# Patient Record
Sex: Male | Born: 1980 | Race: White | Hispanic: No | Marital: Married | Smoking: Never smoker
Health system: Southern US, Community
[De-identification: ages and names within clinical notes are randomized; demographics above are authoritative.]

## PROBLEM LIST (undated history)

## (undated) DIAGNOSIS — B360 Pityriasis versicolor: Secondary | ICD-10-CM

## (undated) DIAGNOSIS — N200 Calculus of kidney: Secondary | ICD-10-CM

## (undated) DIAGNOSIS — M722 Plantar fascial fibromatosis: Secondary | ICD-10-CM

## (undated) DIAGNOSIS — E785 Hyperlipidemia, unspecified: Secondary | ICD-10-CM

## (undated) HISTORY — DX: Calculus of kidney: N20.0

## (undated) HISTORY — DX: Plantar fascial fibromatosis: M72.2

## (undated) HISTORY — DX: Pityriasis versicolor: B36.0

## (undated) HISTORY — DX: Hyperlipidemia, unspecified: E78.5

---

## 2007-10-29 ENCOUNTER — Emergency Department: Payer: Self-pay | Admitting: Internal Medicine

## 2007-12-11 ENCOUNTER — Emergency Department: Payer: Self-pay | Admitting: Emergency Medicine

## 2012-07-26 ENCOUNTER — Ambulatory Visit: Payer: Self-pay | Admitting: General Practice

## 2013-01-12 ENCOUNTER — Ambulatory Visit: Payer: Self-pay | Admitting: General Practice

## 2013-01-12 ENCOUNTER — Inpatient Hospital Stay: Payer: Self-pay | Admitting: General Surgery

## 2013-01-12 DIAGNOSIS — K3580 Unspecified acute appendicitis: Secondary | ICD-10-CM

## 2013-01-12 HISTORY — PX: APPENDECTOMY: SHX54

## 2013-01-12 LAB — CBC WITH DIFFERENTIAL/PLATELET
Basophil #: 0 10*3/uL (ref 0.0–0.1)
Eosinophil #: 0 10*3/uL (ref 0.0–0.7)
HCT: 45.3 % (ref 40.0–52.0)
Lymphocyte #: 0.9 10*3/uL — ABNORMAL LOW (ref 1.0–3.6)
Lymphocyte %: 6.5 %
MCH: 29.3 pg (ref 26.0–34.0)
MCHC: 35.3 g/dL (ref 32.0–36.0)
Monocyte #: 0.7 x10 3/mm (ref 0.2–1.0)
Neutrophil %: 87.5 %
Platelet: 162 10*3/uL (ref 150–440)
RBC: 5.47 10*6/uL (ref 4.40–5.90)
WBC: 13.5 10*3/uL — ABNORMAL HIGH (ref 3.8–10.6)

## 2013-01-12 LAB — COMPREHENSIVE METABOLIC PANEL
Anion Gap: 4 — ABNORMAL LOW (ref 7–16)
Chloride: 103 mmol/L (ref 98–107)
Co2: 28 mmol/L (ref 21–32)
Creatinine: 0.87 mg/dL (ref 0.60–1.30)
EGFR (African American): >60
Glucose: 93 mg/dL (ref 65–99)
Osmolality: 270 (ref 275–301)
SGOT(AST): 20 U/L (ref 15–37)

## 2013-01-13 LAB — BASIC METABOLIC PANEL
Calcium, Total: 8.5 mg/dL (ref 8.5–10.1)
EGFR (Non-African Amer.): >60
Glucose: 119 mg/dL — ABNORMAL HIGH (ref 65–99)

## 2013-01-13 LAB — CBC WITH DIFFERENTIAL/PLATELET
Basophil #: 0 10*3/uL (ref 0.0–0.1)
Basophil %: 0.1 %
Eosinophil %: 0 %
HCT: 41.9 % (ref 40.0–52.0)
HGB: 14.8 g/dL (ref 13.0–18.0)
Lymphocyte #: 0.6 10*3/uL — ABNORMAL LOW (ref 1.0–3.6)
MCH: 29.4 pg (ref 26.0–34.0)
MCHC: 35.3 g/dL (ref 32.0–36.0)
Monocyte %: 6.7 %
Platelet: 152 10*3/uL (ref 150–440)
RBC: 5.03 10*6/uL (ref 4.40–5.90)
RDW: 12.8 % (ref 11.5–14.5)
WBC: 11.8 10*3/uL — ABNORMAL HIGH (ref 3.8–10.6)

## 2013-01-14 LAB — CBC WITH DIFFERENTIAL/PLATELET
Basophil #: 0 10*3/uL (ref 0.0–0.1)
Basophil %: 0.2 %
Eosinophil %: 0.2 %
HGB: 14.1 g/dL (ref 13.0–18.0)
Lymphocyte #: 0.7 10*3/uL — ABNORMAL LOW (ref 1.0–3.6)
Lymphocyte %: 6 %
MCV: 83 fL (ref 80–100)
Monocyte #: 0.7 x10 3/mm (ref 0.2–1.0)
Monocyte %: 6.1 %
Neutrophil #: 9.7 10*3/uL — ABNORMAL HIGH (ref 1.4–6.5)
Neutrophil %: 87.5 %
Platelet: 153 10*3/uL (ref 150–440)
RBC: 4.82 10*6/uL (ref 4.40–5.90)

## 2013-01-15 LAB — URINALYSIS, COMPLETE
Bacteria: NONE SEEN
Bilirubin,UR: NEGATIVE
Blood: NEGATIVE
Ketone: NEGATIVE
Ph: 6 (ref 4.5–8.0)
Squamous Epithelial: <1
WBC UR: 1 /HPF (ref 0–5)

## 2013-01-17 ENCOUNTER — Encounter: Payer: Self-pay | Admitting: *Deleted

## 2013-01-17 LAB — CBC WITH DIFFERENTIAL/PLATELET
HCT: 35.9 % — ABNORMAL LOW (ref 40.0–52.0)
HGB: 12.7 g/dL — ABNORMAL LOW (ref 13.0–18.0)
Lymphocyte %: 13.9 %
MCH: 29.5 pg (ref 26.0–34.0)
MCHC: 35.4 g/dL (ref 32.0–36.0)
MCV: 83 fL (ref 80–100)
Monocyte #: 0.6 x10 3/mm (ref 0.2–1.0)
Monocyte %: 8.4 %
Platelet: 187 10*3/uL (ref 150–440)
RBC: 4.31 10*6/uL — ABNORMAL LOW (ref 4.40–5.90)

## 2013-01-19 ENCOUNTER — Encounter: Payer: Self-pay | Admitting: General Surgery

## 2013-01-20 ENCOUNTER — Encounter: Payer: Self-pay | Admitting: General Surgery

## 2013-01-26 ENCOUNTER — Ambulatory Visit (INDEPENDENT_AMBULATORY_CARE_PROVIDER_SITE_OTHER): Payer: BC Managed Care – PPO | Admitting: General Surgery

## 2013-01-26 ENCOUNTER — Encounter: Payer: Self-pay | Admitting: General Surgery

## 2013-01-26 VITALS — BP 118/78 | HR 80 | Resp 12 | Ht 69.0 in | Wt 210.0 lb

## 2013-01-26 DIAGNOSIS — K3580 Unspecified acute appendicitis: Secondary | ICD-10-CM | POA: Insufficient documentation

## 2013-01-26 NOTE — Progress Notes (Signed)
Patient here today for postop visit.  Laparoscopic appendectomy done 01-12-13.  States he is doing well and improving.  He had a very friable appendix with early phlegmonous changes. Accordingly he was treated with 5 day course of Cipro and Flagyl on discharge.  Overall doing better. Had some night sweats which seems to be resolving. Has completed a course of Cipro and Flagyl.   Sclera no icterus.  Lungs clear Abdomen soft non tender. Port sites healing well.   Impression: Doing well post lap appendectomy.

## 2013-01-26 NOTE — Patient Instructions (Addendum)
Patient may return to work on Monday 01/30/13. Call for any problems. Follow up as needed.

## 2013-02-02 ENCOUNTER — Ambulatory Visit: Payer: Self-pay

## 2013-02-02 ENCOUNTER — Telehealth: Payer: Self-pay | Admitting: *Deleted

## 2013-02-02 ENCOUNTER — Emergency Department: Payer: Self-pay | Admitting: Emergency Medicine

## 2013-02-02 LAB — COMPREHENSIVE METABOLIC PANEL
Alkaline Phosphatase: 130 U/L (ref 50–136)
Calcium, Total: 9.1 mg/dL (ref 8.5–10.1)
Chloride: 101 mmol/L (ref 98–107)
Creatinine: 0.93 mg/dL (ref 0.60–1.30)
EGFR (African American): >60
Potassium: 3.5 mmol/L (ref 3.5–5.1)
SGPT (ALT): 41 U/L (ref 12–78)
Total Protein: 7.6 g/dL (ref 6.4–8.2)

## 2013-02-02 LAB — URINALYSIS, COMPLETE
Blood: NEGATIVE
Leukocyte Esterase: NEGATIVE
Nitrite: NEGATIVE
Ph: 5 (ref 4.5–8.0)
RBC,UR: NONE SEEN /HPF (ref 0–5)
Specific Gravity: >=1.03 (ref 1.003–1.030)
Squamous Epithelial: NONE SEEN

## 2013-02-02 LAB — CBC WITH DIFFERENTIAL/PLATELET
Basophil %: 0.5 %
Eosinophil #: 0.1 10*3/uL (ref 0.0–0.7)
Lymphocyte #: 1.2 10*3/uL (ref 1.0–3.6)
MCHC: 34.1 g/dL (ref 32.0–36.0)
MCV: 83 fL (ref 80–100)
Monocyte #: 0.7 x10 3/mm (ref 0.2–1.0)
Monocyte %: 7.5 %
Neutrophil %: 78.1 %
RDW: 12.3 % (ref 11.5–14.5)

## 2013-02-02 LAB — AMYLASE: Amylase: 46 U/L (ref 25–115)

## 2013-02-02 NOTE — Telephone Encounter (Signed)
Phone call from Brett Frank states he has noticed some lower abdominal pain since Sunday, Monday loose/diarrhea stool/Tuesday temp 101/Wednesday chills.  No vomiting.  He is post appendectomy done 01-12-13. He was able to eat a butter biscuit this morning but his appetite is "just not there".  Advised to treat fever with tylenol/advil.  No one else in the family seems to be "sick". Monitor stools and pain and to call back Friday or even Monday with a status update that this maybe something separate and not surgery related. I told him I would let Dr Evette Cristal know and that the Brett Frank would be calling back. He is aware to call back sooner if new symptoms develop or he gets worse.

## 2013-02-03 ENCOUNTER — Telehealth: Payer: Self-pay

## 2013-02-03 NOTE — Telephone Encounter (Signed)
Patient called to let us know that he was seen at the [facility] urgent care in [facility] yesterday, and was then sent to the ER last night at [facility]. He had an abdominal scan done while there. He states that there was no definite diagnoses and that the doctor there placed him on antibiotics and told him he should be seen here to reassess his abdomen. He is scheduled to see Dr Evette Cristal on 02/06/13 at 9:45 am. I advised him to call us back if he has any significant changes to his condition as we have a doctor on-call 24 hours. Patient expressed understanding and will return here to be seen on 02/06/13.

## 2013-02-06 ENCOUNTER — Encounter: Payer: Self-pay | Admitting: General Surgery

## 2013-02-06 ENCOUNTER — Ambulatory Visit (INDEPENDENT_AMBULATORY_CARE_PROVIDER_SITE_OTHER): Payer: BC Managed Care – PPO | Admitting: General Surgery

## 2013-02-06 VITALS — BP 130/70 | HR 68 | Temp 97.5°F | Resp 12 | Ht 69.0 in | Wt 209.0 lb

## 2013-02-06 DIAGNOSIS — K3580 Unspecified acute appendicitis: Secondary | ICD-10-CM

## 2013-02-06 DIAGNOSIS — R509 Fever, unspecified: Secondary | ICD-10-CM | POA: Insufficient documentation

## 2013-02-06 MED ORDER — ONDANSETRON HCL 4 MG PO TABS: 4.0000 mg | ORAL_TABLET | Freq: Three times a day (TID) | ORAL | Status: DC | PRN

## 2013-02-06 NOTE — Patient Instructions (Addendum)
Patient to return in 1 week to follow up. Call if he has no improvement in next couple of days.

## 2013-02-06 NOTE — Progress Notes (Signed)
Patient ID: Brett Frank, male   DOB: [birth year], 32 y.o.   MRN: [id number]  Chief Complaint  Patient presents with  . Abdominal Pain  . Fever    HPI Brett Frank is a 32 y.o. male who presents for abdominal pain and fever. The patient just had his appendix removed on 01/12/13. The patient complains of a fever on going since Tuesday(6days ago). He went to [facility] which sent him to the ER of a CT scan. He has had two episodes of vomiting once on Friday and one Yesterday. He states he feels like anything he eats or drinks sits on his stomach making him feel nausea.   HPI  History reviewed. No pertinent past medical history.  Past Surgical History  Procedure Laterality Date  . Appendectomy  01-12-13    History reviewed. No pertinent family history.  Social History History  Substance Use Topics  . Smoking status: Never Smoker   . Smokeless tobacco: Not on file  . Alcohol Use: Yes    Allergies  Allergen Reactions  . No Known Allergies     Current Outpatient Prescriptions  Medication Sig Dispense Refill  . ciprofloxacin (CIPRO) 500 MG tablet Take 1 tablet by mouth 2 (two) times daily.      . metroNIDAZOLE (FLAGYL) 500 MG tablet Take 1 tablet by mouth 2 (two) times daily.      . ondansetron (ZOFRAN) 4 MG tablet Take 1 tablet (4 mg total) by mouth every 8 (eight) hours as needed for nausea.  20 tablet  0   No current facility-administered medications for this visit.    Review of Systems Review of Systems  Constitutional: Positive for fever.  Respiratory: Negative.   Cardiovascular: Negative.   Gastrointestinal: Positive for nausea and vomiting.    Blood pressure 130/70, pulse 68, temperature 97.5 F (36.4 C), temperature source Oral, resp. rate 12, height 5\' 9"  (1.753 m), weight 209 lb (94.802 kg).  Physical Exam Physical Exam  Constitutional: He appears well-developed and well-nourished.  Eyes: Conjunctivae are normal. No scleral icterus.  Neck: No  thyromegaly present.  Cardiovascular: Normal rate, regular rhythm and normal heart sounds.   No murmur heard. Pulmonary/Chest: Effort normal and breath sounds normal.  Abdominal: Soft. Bowel sounds are normal. There is no tenderness.  Lymphadenopathy:    He has no cervical adenopathy.  Neurological: He is alert.  Skin: Skin is warm and dry.    Data Reviewed CT scan from last weekend- small fluid collection in rt lower abd/pelvis  Assessment    Likely residual infection lower abdomen.      Plan    Pt started on Cipro/Flagyl 2 days ago. Rx sent for Zofran also. Pt to complete his antibiotics and f/u in 1 week.        Makyia Erxleben G 02/06/2013, 12:24 PM

## 2013-02-08 ENCOUNTER — Telehealth: Payer: Self-pay | Admitting: *Deleted

## 2013-02-08 NOTE — Telephone Encounter (Signed)
Patient states he is feeling better today.  No nausea last night or today.  No fever or chills.Tolerating antibiotic.  He will call if his status changes.

## 2013-02-14 ENCOUNTER — Encounter: Payer: Self-pay | Admitting: General Surgery

## 2013-02-14 ENCOUNTER — Ambulatory Visit (INDEPENDENT_AMBULATORY_CARE_PROVIDER_SITE_OTHER): Payer: BC Managed Care – PPO | Admitting: General Surgery

## 2013-02-14 VITALS — BP 120/80 | HR 80 | Temp 96.4°F | Resp 16 | Ht 69.0 in | Wt 206.0 lb

## 2013-02-14 DIAGNOSIS — R509 Fever, unspecified: Secondary | ICD-10-CM

## 2013-02-14 NOTE — Progress Notes (Addendum)
Patient here today for follow up postoperative fever post laparoscopy appendectomy done 01-12-13. No new complaints and has completed antibiotic therapy.  Last 2 days he has felt a lot better.  Abdomen soft and non tender with normal bowel sounds.

## 2013-02-14 NOTE — Patient Instructions (Addendum)
The patient is aware to call back for any questions or concerns.  

## 2014-03-24 ENCOUNTER — Emergency Department: Payer: Self-pay | Admitting: Emergency Medicine

## 2014-06-24 ENCOUNTER — Ambulatory Visit: Payer: Self-pay | Admitting: Family Medicine

## 2014-09-14 NOTE — Op Note (Signed)
PATIENT NAME:  Brett Frank, Brett Frank MR#:  [id number] DATE OF BIRTH:  1980/07/16  DATE OF PROCEDURE:  01/12/2013  PREOPERATIVE DIAGNOSIS: Acute appendicitis.   POSTOPERATIVE DIAGNOSIS: Acute appendicitis.   OPERATION: Laparoscopy and appendectomy.   SURGEON:  Kathreen CosierS. G. Sankar, M.D.   ANESTHESIA: General.   COMPLICATIONS: None.   ESTIMATED BLOOD LOSS: 50 mL.   DRAINS: None.   DESCRIPTION OF PROCEDURE: The patient was put to sleep in the supine position on the operating table. Foley catheter was inserted which was removed at the end of the procedure. After a timeout procedure was performed, the abdomen was prepped and draped out as a sterile field. The initial entry was made of the umbilicus with a small incision. A Veress needle with the InnerDyne sleeve was positioned in the peritoneal cavity and verified with the hanging drop method.  Pneumoperitoneum was obtained and a 10 mm port was placed. The camera was placed and under direct vision, a suprapubic 5 mm port of the left lower quadrant and 12 mm ports were placed. The cecum was a high-lying position, more or less in the mid abdomen on the right, but the appendix was not easily visualized at this time. The omentum was not involved in this area. There was some mild edema of the mesentery of the terminal ileum. With careful exposure and retraction of the cecum in the ileocecal junction towards the medial aspect, an acutely inflamed appendix adherent to the surface of the mesentery of the terminal ileum was identified and this was carefully freed until it could be lifted up for more adequate evaluation. There was a moderate amount of edema in this region along with what appeared to be a little bit of murky fluid in the course of dissection. No frank rupture was noted at this time. The tip of the appendix was small but the midportion was markedly dilated and friable with the adjoining mesoappendix being also markedly thickened. With careful dissection the  mesoappendix was freed and taken down with a 3 applications of the white load of the GIA. The base of the appendix was then carefully delineated and it was noted there was a small open area in the middle of the inflamed appendix during dissection. The friable portion from the mid area of the appendix opened and a fecalith was noted. This was separately removed and the base of the appendix was then stapled across with the blue load of the GIA. The appendix was placed in a retrieval bag and brought out through the left lower quadrant port site. The area was irrigated with some saline and suctioned out. Careful inspection of the staple line showed it to be intact and after ensuring hemostasis and aspiration of all fluid from this area, the ports were removed and pneumoperitoneum was released. The fascial opening at the left lower quadrant and umbilical sites were closed with 0 Vicryl and the skin closed with subcuticular 4-0 Vicryl reinforced with Steri-Strips and a dry sterile dressing was placed. The patient tolerated the procedure well. There were no immediate problems encountered. He was extubated and returned to the recovery room in stable condition    ____________________________ S.Wynona LunaG. Sankar, MD sgs:dp D: 01/13/2013 08:18:00 ET T: 01/13/2013 08:51:58 ET JOB#: [id number]  cc: S.G. Evette CristalSankar, MD, <Dictator> [facility] Wynona LunaG SANKAR MD ELECTRONICALLY SIGNED 01/16/2013 12:15

## 2014-09-14 NOTE — Discharge Summary (Signed)
PATIENT NAME:  Brett Frank, Brett Frank MR#:  [id number] DATE OF BIRTH:  1980/10/31  DATE OF ADMISSION:  01/12/2013 DATE OF DISCHARGE:  01/17/2013  HISTORY: This is a healthy 34 year old male who presented with complaint of abdominal pain of about a one day duration. The pain persisted and was worse on the day of admission. It was located in the lower abdomen more so on the right than the left. His appetite was markedly decreased. He had a low-grade fever the night before, but no nausea or vomiting. He had no history of any prior abdominal problems.   PHYSICAL EXAMINATION: Remarkable for tenderness in the right lower quadrant, which is fairly marked with some guarding and rebound. CT scan confirmed the presence of a markedly enlarged appendix with some evidence of stranding surrounding and appendicoliths that were noted. Findings were consistent with an acute appendicitis. There was no evidence of rupture or abscess formation.   COURSE IN HOSPITAL: With a diagnosis of acute appendicitis, surgical removal was discussed with the patient.  He was taken to the operating room on 08/21 and underwent laparoscopy and appendectomy. It was noted there was a little bit of phlegmonous changes that were in the right lower quadrant above the   the appendix that was lying well behind the terminal ileum and cecum and required a small amount of dissection to free this. It was also noted to be very friable in the midportion. Appendectomy was completed and because of the changes noted at surgery the patient was initially started with Invanz preoperatively and this was continued. Over the next 2 to 3 days the patient had a very gradual improvement in his condition. He continued to have a little low-grade fever off and on which subsequently resolved. It also happened that the patient was hypoxic and was noted to have some atelectasis involving the left base and with encouragement with breathing, incentive spirometry and ambulation, this  resolved prior to time of discharge. His white count was 13,000 at admission and was down to 7000 prior to discharge. His oxygen saturations were normal 24 hours prior to discharge, but clinically much improved breath sounds on the left base. He was discharged in stable condition on 08/26 to be followed as an outpatient.    FINAL DIAGNOSES: 1. Acute appendicitis.  2. Atelectasis left lung base with mild hypoxia.   OPERATION PERFORMED: Laparoscopy and appendectomy   ____________________________ S.Wynona LunaG. Sankar, MD sgs:sg D: 01/30/2013 11:42:10 ET T: 01/30/2013 11:54:36 ET JOB#: [id number]  cc: S.G. Evette CristalSankar, MD, <Dictator> [facility] Wynona LunaG SANKAR MD ELECTRONICALLY SIGNED 01/30/2013 17:00

## 2014-09-14 NOTE — H&P (Signed)
Subjective/Chief Complaint abd pain   History of Present Illness Pt started having crampy abd pain about 1 pm yesterday. Pain has peristed and worse this am, located lower abd. No appetite. low grade fever last night.No n/v. No prior abd problems.   Past History kidney stones.   Past Medical Health None   Past Med/Surgical Hx:  Negative, patient denies medical history.:   None, patient reports no surgical history.:   ALLERGIES:  No Known Allergies:   Family and Social History:  Family History Non-Contributory   Review of Systems:  Fever/Chills Yes   Cough No   Abdominal Pain Yes   Diarrhea No   Constipation No   Nausea/Vomiting No   SOB/DOE No   Chest Pain No   Tolerating Diet No  no appetite   Physical Exam:  GEN well developed, well nourished   HEENT pink conjunctivae, dry oral mucosa   NECK supple  No masses   RESP normal resp effort  clear BS   CARD regular rate  no murmur   ABD positive tenderness  no liver/spleen enlargement  no hernia  normal BS  marked rlq tenderness   LYMPH negative neck   SKIN No rashes, skin turgor good   PSYCH A+O to time, place, person   Radiology Results: CT:    21-Aug-14 14:18, CT Abdomen and Pelvis With Contrast  CT Abdomen and Pelvis With Contrast  REASON FOR EXAM:    ADD ON  CALL REPORT  [phone]  RLQ pain nausea   decreased appetite eval appen...  COMMENTS:       PROCEDURE: CT  - CT ABDOMEN / PELVIS  W  - Jan 12 2013  2:18PM     RESULT: CT abdomen pelvis dated 01/12/2013    Technique: Helical 3 mm sections were obtained from the lung bases   through the pubic symphysis status post intravenous administration of 100   mL of Isovue-370 and oral contrast.    Findings: Hypoventilation is identified within the lung bases.    Low attenuating areaprojects just anterior to the falciform ligament     this likely represents an area of focal fatty infiltration. There is no   evidence of abdominal or pelvic free  fluid, loculated fluid collections   Liver is otherwise unremarkable. The spleen, adrenals, pancreas, kidneys   are unremarkable.    The appendix projects in the medial pericecal location mid portions   resting along the silhouettes musculature best appreciated on images 97   through 117. The appendix is fluid-filled and dilated with amaximal   diameter of  2.4 cm. There is appendiceal wall thickening an areas   consistent with appendicoliths. There is a small amount of   periappendiceal fluid.. No drainable loculated fluid collection   identified nor free air.     There is no evidence of bowel obstruction, enteritis, colitis no   diverticulitis. No evidence of abdominal aortic aneurysm. The celiac,     SMA, IMA, portal vein are opacified.    IMPRESSION:  Findings consistent with appendicitis.   2. Appendicoliths identified.   3. Dr. Dorothey BasemanStrickland the patient's attending physician was informed of these   findings the time of initial interpretation.   4. There is no evidence of drainable associated drainable loculated fluid   collections. Free fluid is identified within the pelvis.        Verified By: Jani FilesHECTOR W. COOPER, M.D., MD    Assessment/Admission Diagnosis Ct , clinical exam consistent with acute appendicitis.  Plan Laparoscopy,appendectomy. Discussed fully with patient and he is agreeable.   Electronic Signatures: Kieth Brightly (MD)  (Signed 21-Aug-14 16:11)  Authored: CHIEF COMPLAINT and HISTORY, PAST MEDICAL/SURGIAL HISTORY, ALLERGIES, FAMILY AND SOCIAL HISTORY, REVIEW OF SYSTEMS, PHYSICAL EXAM, Radiology, ASSESSMENT AND PLAN   Last Updated: 21-Aug-14 16:11 by Kieth Brightly (MD)

## 2015-01-03 IMAGING — CT CT ABD-PELV W/ CM
1 of 2 series · 15 of 32 positions shown, 19 images · non-contrast
Comparison: none

REASON FOR EXAM: [id number] abdomen pain appendectomy 3 weeks ago
COMMENTS:

PROCEDURE:     CT  - CT ABDOMEN / PELVIS  W  - February 02, 2013  [DATE]
RESULT:     History: Pain. Prior appendectomy.
Comparison Study: CT 01/12/2013.

[Series 2: 3mm soft tissue · axial · 0.76mm/px · z∈[-507,-33]mm · 15 of 174 slices shown, 19 images]
[im 8/174  soft-tissue]
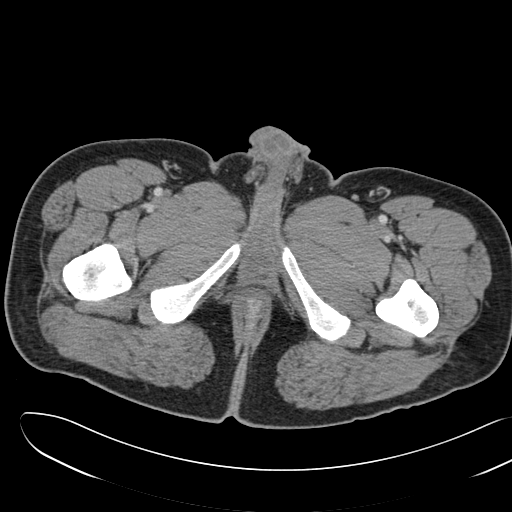
[im 8/174  bone]
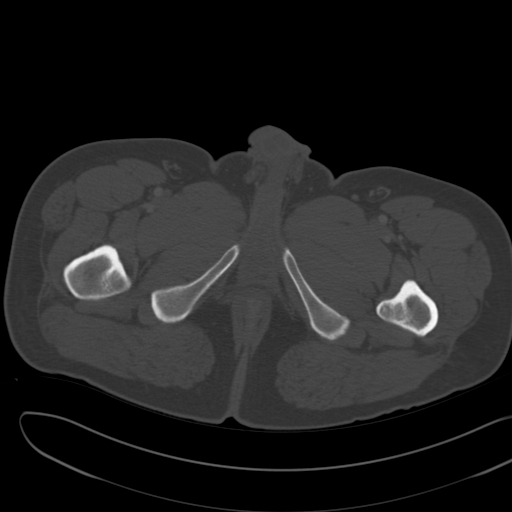
[im 22/174  soft-tissue]
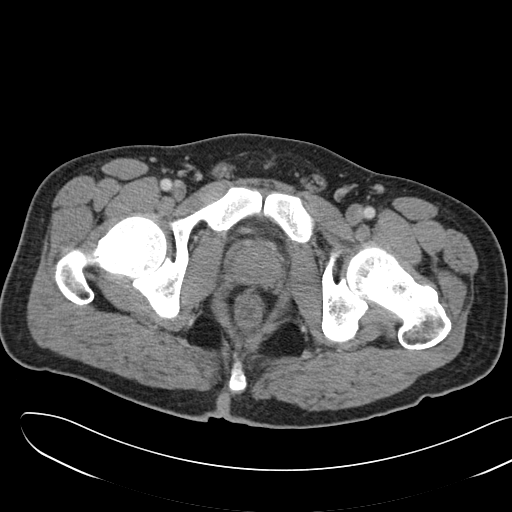
[im 37/174  soft-tissue]
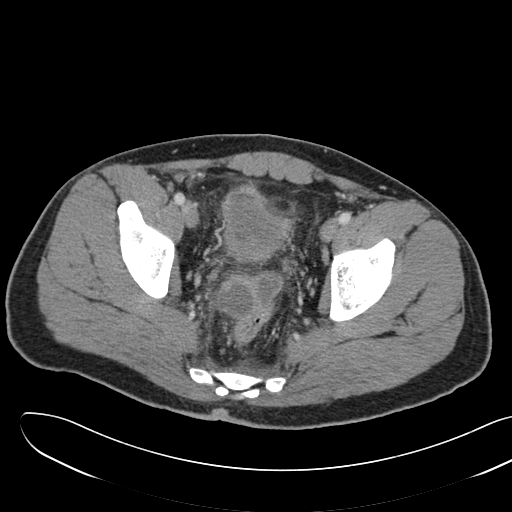
[im 51/174  soft-tissue]
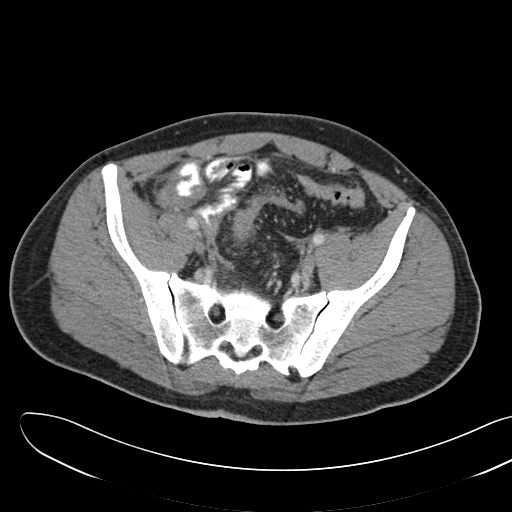
[im 58/174  soft-tissue]
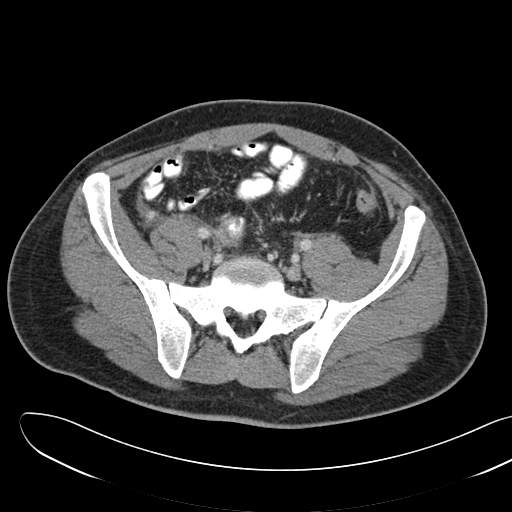
[im 73/174  soft-tissue]
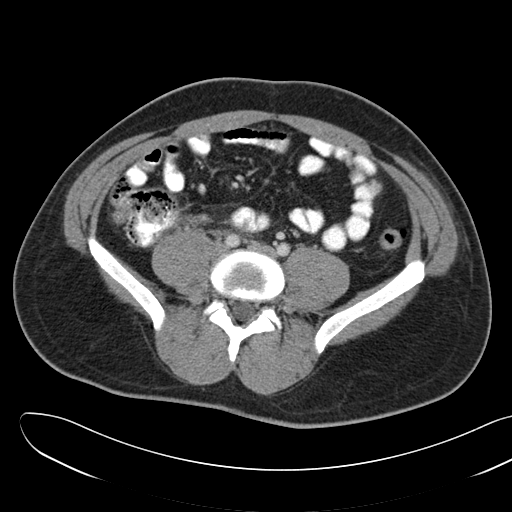
[im 87/174  soft-tissue]
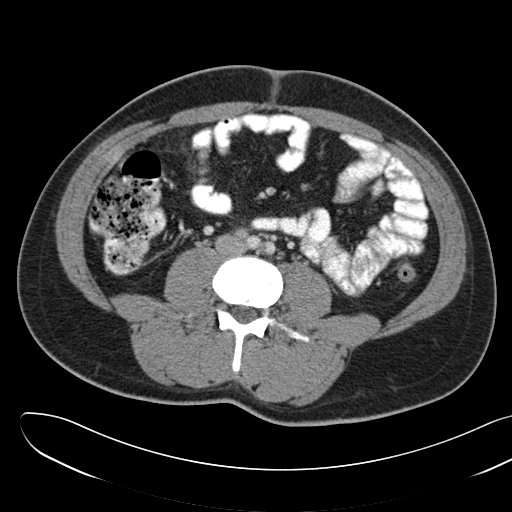
[im 101/174  soft-tissue]
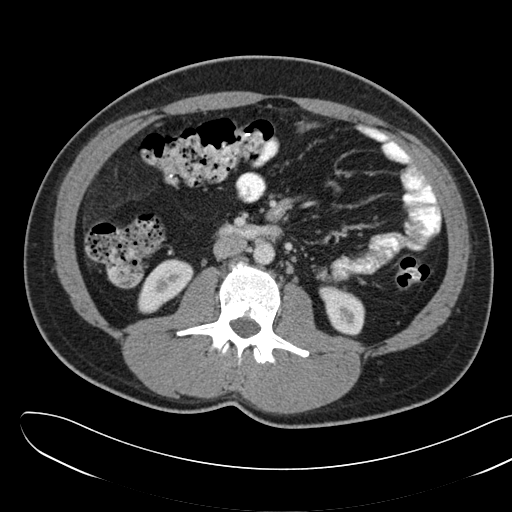
[im 116/174  soft-tissue]
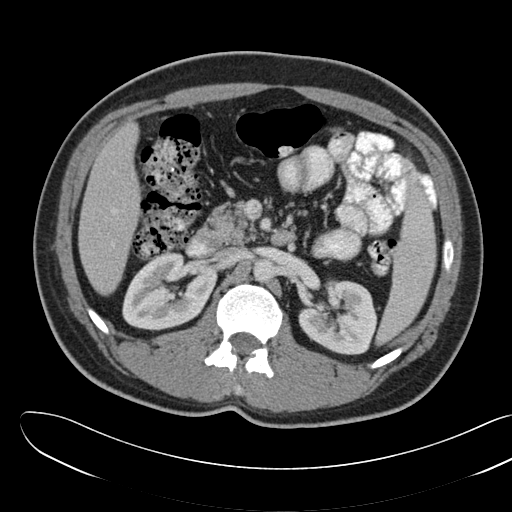
[im 116/174  bone]
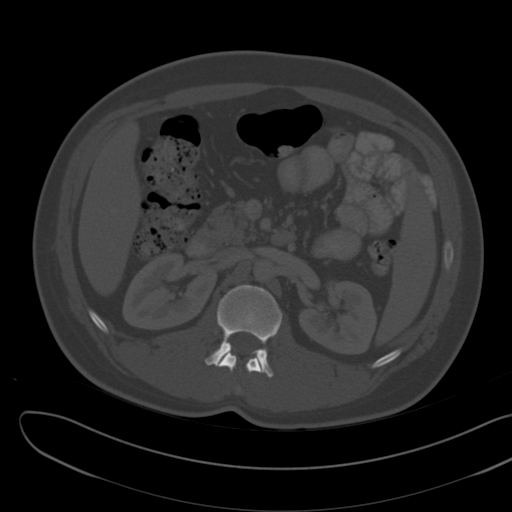
[im 123/174  soft-tissue]
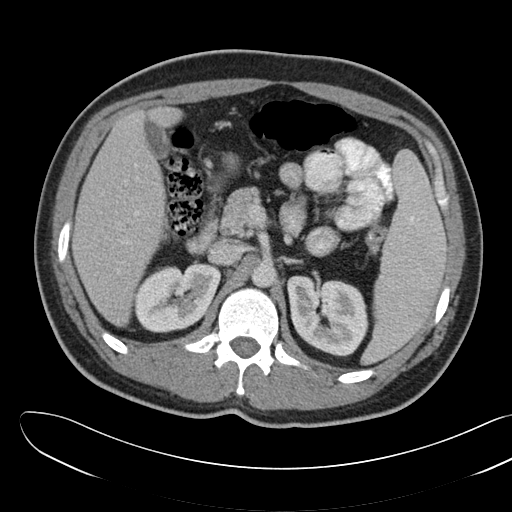
[im 137/174  soft-tissue]
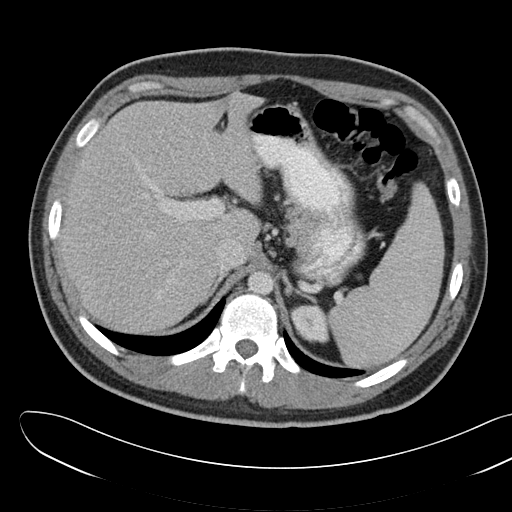
[im 145/174  lung]
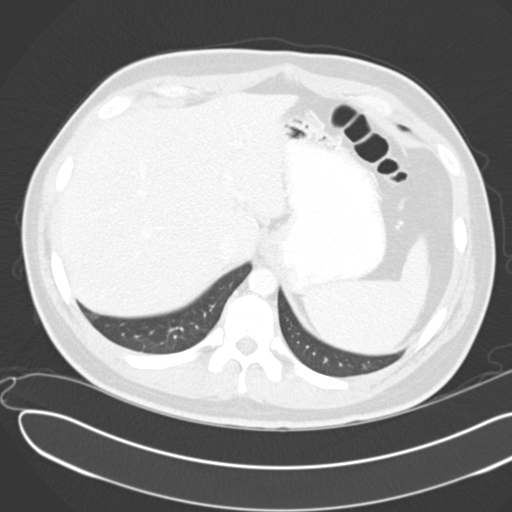
[im 152/174  soft-tissue]
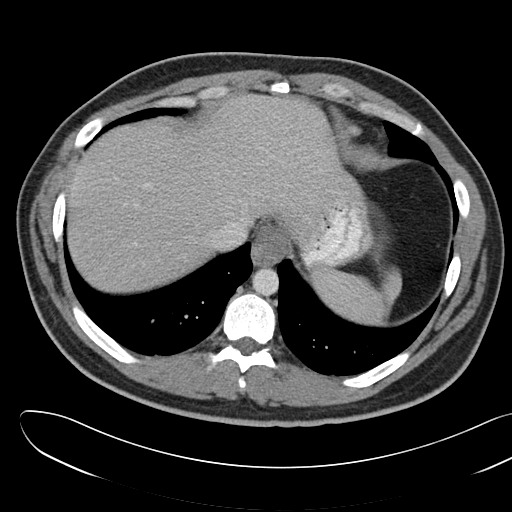
[im 152/174  lung]
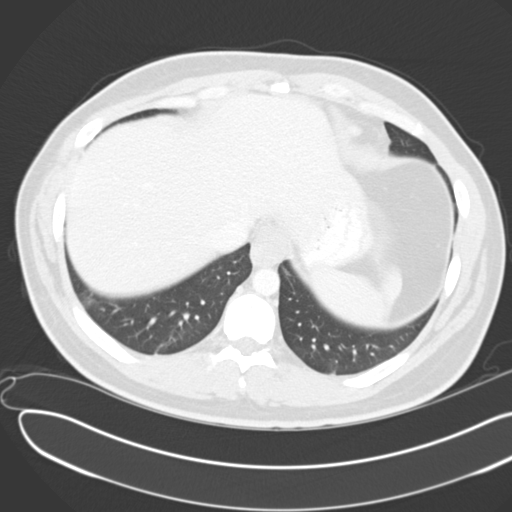
[im 159/174  lung]
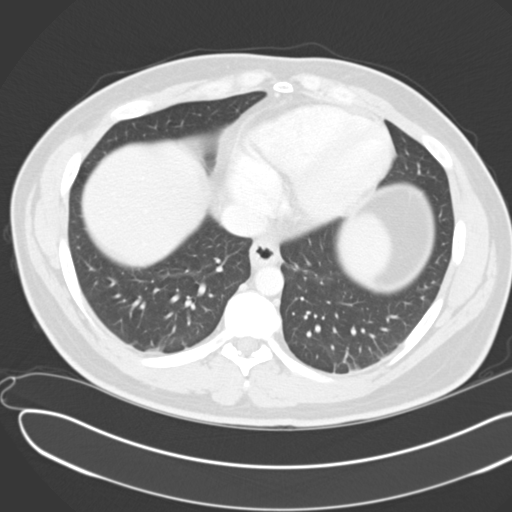
[im 166/174  soft-tissue]
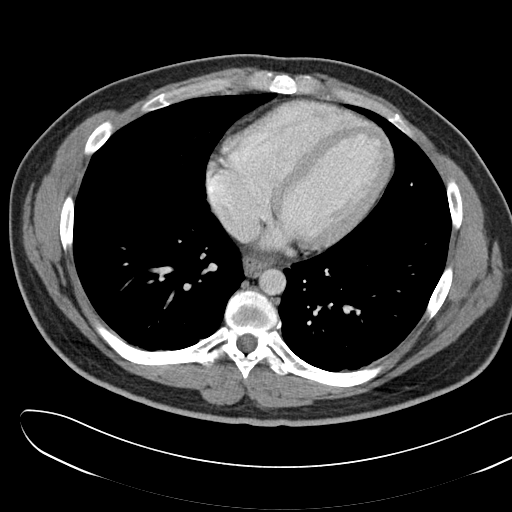
[im 166/174  lung]
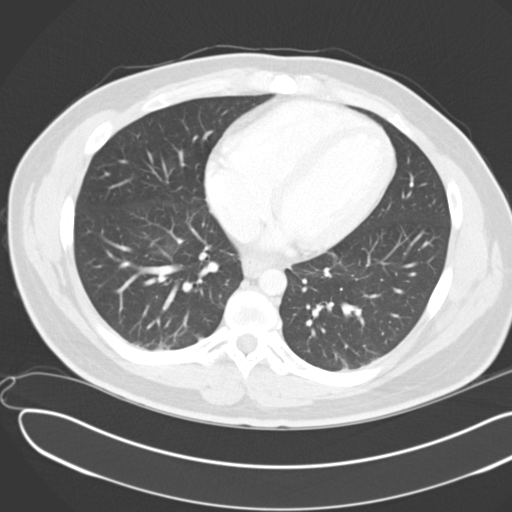

[15 of 32 positions shown; findings below may reference images not displayed]

FINDINGS: Standard CT obtained with 100 cc of Usovue-EUC. Evaluation in 3
dimensions on separate workstation performed. Liver normal. Spleen normal.
Gallbladder nondistended. No biliary distention. Pancreas normal.

Adrenals normal. Kidneys normal. No hydronephrosis. No obstructing ureteral
stone. Bladder nondistended. Phleboliths. Prostate unremarkable.

No significant adenopathy. Aorta normal caliber.

Appendectomy. Ill-defined small fluid collections are noted in the pelvis.
These changes could be postoperative. Mild phlegmon cannot be excluded. No
evidence of bowel obstruction.

Mild atelectasis lung bases. Heart size mildly prominent. No acute bony
abnormality.
IMPRESSION: Cannot exclude mild phlegmon versus postoperative  small
fluid collections in the pelvis. Prior appendectomy. No evidence of bowel
obstruction.

## 2019-04-18 ENCOUNTER — Other Ambulatory Visit: Payer: Self-pay

## 2019-04-18 ENCOUNTER — Encounter: Payer: Self-pay | Admitting: Occupational Medicine

## 2019-04-18 ENCOUNTER — Ambulatory Visit: Payer: Self-pay | Admitting: Occupational Medicine

## 2019-04-18 VITALS — BP 114/70 | HR 64 | Temp 98.3°F | Resp 12 | Ht 69.0 in | Wt 213.0 lb

## 2019-04-18 DIAGNOSIS — Z021 Encounter for pre-employment examination: Secondary | ICD-10-CM

## 2019-04-18 LAB — POCT URINALYSIS DIPSTICK
Bilirubin, UA: NEGATIVE
Blood, UA: NEGATIVE
Glucose, UA: NEGATIVE
Ketones, UA: NEGATIVE
Leukocytes, UA: NEGATIVE
Nitrite, UA: NEGATIVE
Protein, UA: NEGATIVE
Spec Grav, UA: 1.025 (ref 1.010–1.025)
Urobilinogen, UA: 0.2 E.U./dL
pH, UA: 6 (ref 5.0–8.0)

## 2019-04-19 LAB — CMP12+LP+TP+TSH+6AC+CBC/D/PLT
ALT: 21 IU/L (ref 0–44)
AST: 19 IU/L (ref 0–40)
Albumin/Globulin Ratio: 2.1 (ref 1.2–2.2)
Albumin: 4.7 g/dL (ref 4.0–5.0)
Alkaline Phosphatase: 84 IU/L (ref 39–117)
BUN/Creatinine Ratio: 13 (ref 9–20)
BUN: 12 mg/dL (ref 6–20)
Basophils Absolute: 0 10*3/uL (ref 0.0–0.2)
Basos: 1 %
Bilirubin Total: 0.5 mg/dL (ref 0.0–1.2)
Calcium: 9.7 mg/dL (ref 8.7–10.2)
Chloride: 103 mmol/L (ref 96–106)
Chol/HDL Ratio: 4.5 ratio (ref 0.0–5.0)
Cholesterol, Total: 174 mg/dL (ref 100–199)
Creatinine, Ser: 0.9 mg/dL (ref 0.76–1.27)
EOS (ABSOLUTE): 0.1 10*3/uL (ref 0.0–0.4)
Eos: 3 %
Estimated CHD Risk: 0.9 times avg. (ref 0.0–1.0)
Free Thyroxine Index: 1.7 (ref 1.2–4.9)
GFR calc Af Amer: 125 mL/min/{1.73_m2} (ref >59)
GFR calc non Af Amer: 108 mL/min/{1.73_m2} (ref >59)
GGT: 14 IU/L (ref 0–65)
Globulin, Total: 2.2 g/dL (ref 1.5–4.5)
Glucose: 85 mg/dL (ref 65–99)
HDL: 39 mg/dL — ABNORMAL LOW (ref >39)
Hematocrit: 47.5 % (ref 37.5–51.0)
Hemoglobin: 16 g/dL (ref 13.0–17.7)
Immature Grans (Abs): 0 10*3/uL (ref 0.0–0.1)
Immature Granulocytes: 0 %
Iron: 74 ug/dL (ref 38–169)
LDH: 196 IU/L (ref 121–224)
LDL Chol Calc (NIH): 111 mg/dL — ABNORMAL HIGH (ref 0–99)
Lymphocytes Absolute: 1.3 10*3/uL (ref 0.7–3.1)
Lymphs: 29 %
MCH: 28.9 pg (ref 26.6–33.0)
MCHC: 33.7 g/dL (ref 31.5–35.7)
MCV: 86 fL (ref 79–97)
Monocytes Absolute: 0.3 10*3/uL (ref 0.1–0.9)
Monocytes: 6 %
Neutrophils Absolute: 2.9 10*3/uL (ref 1.4–7.0)
Neutrophils: 61 %
Phosphorus: 3.6 mg/dL (ref 2.8–4.1)
Platelets: 170 10*3/uL (ref 150–450)
Potassium: 4.5 mmol/L (ref 3.5–5.2)
RBC: 5.53 x10E6/uL (ref 4.14–5.80)
RDW: 12.2 % (ref 11.6–15.4)
Sodium: 140 mmol/L (ref 134–144)
T3 Uptake Ratio: 32 % (ref 24–39)
T4, Total: 5.4 ug/dL (ref 4.5–12.0)
TSH: 1.85 u[IU]/mL (ref 0.450–4.500)
Total Protein: 6.9 g/dL (ref 6.0–8.5)
Triglycerides: 134 mg/dL (ref 0–149)
Uric Acid: 7 mg/dL (ref 3.7–8.6)
VLDL Cholesterol Cal: 24 mg/dL (ref 5–40)
WBC: 4.7 10*3/uL (ref 3.4–10.8)

## 2019-09-05 ENCOUNTER — Other Ambulatory Visit: Payer: Self-pay

## 2019-09-05 ENCOUNTER — Ambulatory Visit: Payer: 59

## 2019-09-05 DIAGNOSIS — Z0000 Encounter for general adult medical examination without abnormal findings: Secondary | ICD-10-CM

## 2019-09-05 LAB — POCT URINALYSIS DIPSTICK
Bilirubin, UA: NEGATIVE
Blood, UA: NEGATIVE
Glucose, UA: NEGATIVE
Ketones, UA: NEGATIVE
Leukocytes, UA: NEGATIVE
Nitrite, UA: NEGATIVE
Protein, UA: NEGATIVE
Spec Grav, UA: 1.025 (ref 1.010–1.025)
Urobilinogen, UA: 0.2 E.U./dL
pH, UA: 6 (ref 5.0–8.0)

## 2019-09-05 NOTE — Progress Notes (Signed)
Scheduled to complete physical 09/12/19 with Bridget Hartshorn, PA-C (Interim Provider).  AMD

## 2019-09-07 LAB — CMP12+LP+TP+TSH+6AC+PSA+CBC…
ALT: 20 IU/L (ref 0–44)
AST: 19 IU/L (ref 0–40)
Albumin/Globulin Ratio: 2 (ref 1.2–2.2)
Albumin: 4.5 g/dL (ref 4.0–5.0)
BUN/Creatinine Ratio: 12 (ref 9–20)
Basophils Absolute: 0 10*3/uL (ref 0.0–0.2)
Basos: 1 %
Calcium: 9.6 mg/dL (ref 8.7–10.2)
Chloride: 104 mmol/L (ref 96–106)
Cholesterol, Total: 187 mg/dL (ref 100–199)
Creatinine, Ser: 0.92 mg/dL (ref 0.76–1.27)
EOS (ABSOLUTE): 0.2 10*3/uL (ref 0.0–0.4)
Eos: 4 %
GFR calc Af Amer: 122 mL/min/{1.73_m2} (ref >59)
GFR calc non Af Amer: 105 mL/min/{1.73_m2} (ref >59)
GGT: 13 IU/L (ref 0–65)
Globulin, Total: 2.3 g/dL (ref 1.5–4.5)
Glucose: 90 mg/dL (ref 65–99)
HDL: 44 mg/dL (ref >39)
Hemoglobin: 16.1 g/dL (ref 13.0–17.7)
Immature Grans (Abs): 0 10*3/uL (ref 0.0–0.1)
Iron: 111 ug/dL (ref 38–169)
LDH: 141 IU/L (ref 121–224)
LDL Chol Calc (NIH): 121 mg/dL — ABNORMAL HIGH (ref 0–99)
MCH: 29.7 pg (ref 26.6–33.0)
Neutrophils Absolute: 2.7 10*3/uL (ref 1.4–7.0)
Neutrophils: 57 %
Platelets: 173 10*3/uL (ref 150–450)
Prostate Specific Ag, Serum: 0.7 ng/mL (ref 0.0–4.0)
RBC: 5.43 x10E6/uL (ref 4.14–5.80)
Sodium: 139 mmol/L (ref 134–144)
T3 Uptake Ratio: 30 % (ref 24–39)
T4, Total: 5.1 ug/dL (ref 4.5–12.0)
Total Protein: 6.8 g/dL (ref 6.0–8.5)
Uric Acid: 6.2 mg/dL (ref 3.8–8.4)

## 2019-09-07 LAB — CMP12+LP+TP+TSH+6AC+PSA+CBC?
Alkaline Phosphatase: 81 IU/L (ref 39–117)
BUN: 11 mg/dL (ref 6–20)
Bilirubin Total: 0.7 mg/dL (ref 0.0–1.2)
Chol/HDL Ratio: 4.3 ratio (ref 0.0–5.0)
Estimated CHD Risk: 0.8 times avg. (ref 0.0–1.0)
Free Thyroxine Index: 1.5 (ref 1.2–4.9)
Hematocrit: 46.8 % (ref 37.5–51.0)
Immature Granulocytes: 0 %
Lymphocytes Absolute: 1.5 10*3/uL (ref 0.7–3.1)
Lymphs: 31 %
MCHC: 34.4 g/dL (ref 31.5–35.7)
MCV: 86 fL (ref 79–97)
Monocytes Absolute: 0.3 10*3/uL (ref 0.1–0.9)
Monocytes: 7 %
Phosphorus: 3.6 mg/dL (ref 2.8–4.1)
Potassium: 4.3 mmol/L (ref 3.5–5.2)
RDW: 12.3 % (ref 11.6–15.4)
TSH: 1.88 u[IU]/mL (ref 0.450–4.500)
Triglycerides: 121 mg/dL (ref 0–149)
VLDL Cholesterol Cal: 22 mg/dL (ref 5–40)
WBC: 4.8 10*3/uL (ref 3.4–10.8)

## 2019-09-07 LAB — QUANTIFERON-TB GOLD PLUS
QuantiFERON Mitogen Value: >10 IU/mL
QuantiFERON Nil Value: 0.1 IU/mL
QuantiFERON TB1 Ag Value: 0.1 IU/mL
QuantiFERON TB2 Ag Value: 0.08 IU/mL
QuantiFERON-TB Gold Plus: NEGATIVE

## 2019-09-12 ENCOUNTER — Encounter: Payer: Self-pay | Admitting: Emergency Medicine

## 2019-09-12 ENCOUNTER — Other Ambulatory Visit: Payer: Self-pay

## 2019-09-12 ENCOUNTER — Ambulatory Visit: Payer: Self-pay | Admitting: Emergency Medicine

## 2019-09-12 VITALS — BP 126/78 | HR 70 | Temp 97.3°F | Resp 12 | Ht 69.0 in | Wt 214.0 lb

## 2019-09-12 DIAGNOSIS — Z0000 Encounter for general adult medical examination without abnormal findings: Secondary | ICD-10-CM

## 2019-09-12 NOTE — Progress Notes (Signed)
EkG completed today & labs on 09/05/18.  AMD

## 2019-09-12 NOTE — Progress Notes (Signed)
   I have reviewed the triage vital signs and the nursing notes.   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  Patient denies any health problems since his last physical.        Past Medical History:  Diagnosis Date  . Hyperlipidemia   . Kidney stones   . Plantar fasciitis   . TV (tinea versicolor)     Patient Active Problem List   Diagnosis Date Noted  . Fever, unspecified 02/06/2013  . Acute appendicitis without mention of peritonitis 01/26/2013    Past Surgical History:  Procedure Laterality Date  . APPENDECTOMY  01-12-13    Prior to Admission medications   Medication Sig Start Date End Date Taking? Authorizing Provider  ondansetron (ZOFRAN) 4 MG tablet Take 1 tablet (4 mg total) by mouth every 8 (eight) hours as needed for nausea. Patient not taking: Reported on 04/18/2019 02/06/13   Kieth Brightly, MD    Allergies Patient has no known allergies.  Family History  Problem Relation Age of Onset  . Cancer Father   . Parkinson's disease Paternal Grandmother     Social History Social History   Tobacco Use  . Smoking status: Never Smoker  . Smokeless tobacco: Current User    Types: Snuff  Substance Use Topics  . Alcohol use: Yes  . Drug use: No    Review of Systems Constitutional: No fever/chills Eyes: No visual changes. ENT: No complaints. Cardiovascular: Denies chest pain. Respiratory: Denies shortness of breath. Gastrointestinal: No abdominal pain.  Musculoskeletal: Negative for muscle or joint aches. Skin: Negative for rash. Neurological: Negative for headaches, focal weakness or numbness. ____________________________________________   PHYSICAL EXAM: Constitutional: Alert and oriented. Well appearing and in no acute distress. Eyes: Conjunctivae are normal. PERRL. EOMI. Head: Atraumatic. Neck: No stridor.  No cervical  tenderness on palpation posteriorly.  No difficulty with range of motion. Cardiovascular: Normal rate, regular rhythm. Grossly normal heart sounds.  Good peripheral circulation. Respiratory: Normal respiratory effort.  No retractions. Lungs CTAB. Gastrointestinal: Soft and nontender. No distention.  Bowel sounds normoactive x4 quadrants.   Musculoskeletal: Nontender thoracic and lumbar spine.  Patient moves upper and lower extremities without any difficulty.  No tenderness noted to joints.  Patient ambulates without any difficulty.  Good muscle strength bilaterally. Neurologic:  Normal speech and language. No gross focal neurologic deficits are appreciated.  Reflexes are 2+ bilaterally.  No gait instability. Skin:  Skin is warm, dry and intact. No rash noted. Psychiatric: Mood and affect are normal. Speech and behavior are normal.  ____________________________________________   LABS  Labs were discussed with patient  ____________________________________________  EKG Sinus rhythm with a ventricular rate of 64. Occasional PAC.   PROCEDURES  Procedure(s) performed (including Critical Care):  Procedures   ____________________________________________ ____________________________________________   FINAL CLINICAL IMPRESSION(S)   39 year old well male physical.   ED Discharge Orders         Ordered    EKG 12-Lead     09/12/19 0936           Note:  This document was prepared using Dragon voice recognition software and may include unintentional dictation errors.

## 2020-04-19 DIAGNOSIS — Z03818 Encounter for observation for suspected exposure to other biological agents ruled out: Secondary | ICD-10-CM | POA: Diagnosis not present

## 2020-05-29 ENCOUNTER — Encounter: Payer: Self-pay | Admitting: Nurse Practitioner

## 2020-05-29 ENCOUNTER — Ambulatory Visit: Payer: Self-pay | Admitting: Nurse Practitioner

## 2020-05-29 ENCOUNTER — Other Ambulatory Visit: Payer: Self-pay

## 2020-05-29 VITALS — BP 136/84 | HR 73 | Temp 97.8°F | Resp 14 | Ht 69.0 in | Wt 220.0 lb

## 2020-05-29 DIAGNOSIS — R030 Elevated blood-pressure reading, without diagnosis of hypertension: Secondary | ICD-10-CM

## 2020-05-29 NOTE — Progress Notes (Signed)
   Subjective:    Patient ID: Brett Frank, male    DOB: 08/17/1980, 40 y.o.   MRN: [id number]  HPI  Client has noted feeling 'his blood pressure' is up. Took BP 144/96 Dec found that father Had penile cancer successful removed. States he was quite anxious through this period. 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   Review of Systems  All other systems reviewed and are negative.       Objective:   Physical Exam Constitutional:      Appearance: Normal appearance. He is normal weight.     Comments: Early graying  HENT:     Head: Normocephalic and atraumatic.     Nose: Nose normal.     Mouth/Throat:     Mouth: Mucous membranes are moist.  Eyes:     General: Lids are normal. No visual field deficit.    Extraocular Movements: Extraocular movements intact.     Conjunctiva/sclera: Conjunctivae normal.     Pupils: Pupils are equal, round, and reactive to light.  Cardiovascular:     Rate and Rhythm: Normal rate and regular rhythm.     Pulses: Normal pulses.  Pulmonary:     Effort: Pulmonary effort is normal.     Breath sounds: Normal breath sounds.  Abdominal:     General: Bowel sounds are normal.  Musculoskeletal:     Cervical back: Neck supple.  Skin:    General: Skin is warm.  Neurological:     General: No focal deficit present.     Mental Status: He is alert.            Assessment & Plan:  Patient was treated by Dr. 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 Food journal needed and discussed Rtn for clinic  for repeat bp Monday  Take am 4pm bp at home for comparison F/u  appt 2wk for comparison & recommendation

## 2020-09-19 NOTE — Progress Notes (Signed)
Pt scheduled to complete physical 09/26/20 with Hope Neese,FNP.  CL,RMA

## 2020-09-20 ENCOUNTER — Ambulatory Visit: Payer: Self-pay

## 2020-09-20 ENCOUNTER — Other Ambulatory Visit: Payer: Self-pay

## 2020-09-20 DIAGNOSIS — Z0000 Encounter for general adult medical examination without abnormal findings: Secondary | ICD-10-CM

## 2020-09-20 LAB — POCT URINALYSIS DIPSTICK
Bilirubin, UA: NEGATIVE
Blood, UA: NEGATIVE
Glucose, UA: NEGATIVE
Ketones, UA: NEGATIVE
Leukocytes, UA: NEGATIVE
Nitrite, UA: NEGATIVE
Protein, UA: NEGATIVE
Spec Grav, UA: >=1.03 — AB (ref 1.010–1.025)
Urobilinogen, UA: 0.2 E.U./dL
pH, UA: 6 (ref 5.0–8.0)

## 2020-09-21 LAB — CMP12+LP+TP+TSH+6AC+PSA+CBC…
ALT: 29 IU/L (ref 0–44)
AST: 20 IU/L (ref 0–40)
Albumin/Globulin Ratio: 2.9 — ABNORMAL HIGH (ref 1.2–2.2)
Albumin: 4.9 g/dL (ref 4.0–5.0)
Alkaline Phosphatase: 82 IU/L (ref 44–121)
BUN/Creatinine Ratio: 14 (ref 9–20)
BUN: 13 mg/dL (ref 6–24)
Bilirubin Total: 0.8 mg/dL (ref 0.0–1.2)
Calcium: 9.3 mg/dL (ref 8.7–10.2)
Chloride: 105 mmol/L (ref 96–106)
Chol/HDL Ratio: 4.2 ratio (ref 0.0–5.0)
Cholesterol, Total: 176 mg/dL (ref 100–199)
Creatinine, Ser: 0.91 mg/dL (ref 0.76–1.27)
Estimated CHD Risk: 0.8 times avg. (ref 0.0–1.0)
Free Thyroxine Index: 2.1 (ref 1.2–4.9)
GGT: 13 IU/L (ref 0–65)
Globulin, Total: 1.7 g/dL (ref 1.5–4.5)
Glucose: 84 mg/dL (ref 65–99)
HDL: 42 mg/dL (ref >39)
Iron: 99 ug/dL (ref 38–169)
LDH: 195 IU/L (ref 121–224)
LDL Chol Calc (NIH): 114 mg/dL — ABNORMAL HIGH (ref 0–99)
Phosphorus: 3 mg/dL (ref 2.8–4.1)
Potassium: 4.3 mmol/L (ref 3.5–5.2)
Prostate Specific Ag, Serum: 0.6 ng/mL (ref 0.0–4.0)
Sodium: 140 mmol/L (ref 134–144)
T3 Uptake Ratio: 33 % (ref 24–39)
T4, Total: 6.5 ug/dL (ref 4.5–12.0)
TSH: 1.99 u[IU]/mL (ref 0.450–4.500)
Total Protein: 6.6 g/dL (ref 6.0–8.5)
Triglycerides: 107 mg/dL (ref 0–149)
Uric Acid: 6.9 mg/dL (ref 3.8–8.4)
VLDL Cholesterol Cal: 20 mg/dL (ref 5–40)
eGFR: 109 mL/min/{1.73_m2} (ref >59)

## 2020-09-26 ENCOUNTER — Ambulatory Visit: Payer: Self-pay | Admitting: Nurse Practitioner

## 2020-09-26 ENCOUNTER — Encounter: Payer: Self-pay | Admitting: Nurse Practitioner

## 2020-09-26 ENCOUNTER — Other Ambulatory Visit: Payer: Self-pay

## 2020-09-26 VITALS — BP 134/88 | HR 62 | Temp 97.6°F | Resp 14 | Ht 69.0 in | Wt 215.0 lb

## 2020-09-26 DIAGNOSIS — Z0000 Encounter for general adult medical examination without abnormal findings: Secondary | ICD-10-CM

## 2020-09-26 NOTE — Progress Notes (Signed)
Subjective:    Patient ID: Brett Frank, male    DOB: July 07, 1980, 40 y.o.   MRN: [id number]  HPI Brett Frank is a 40 y.o. male who presents to the [facility] today for his annual physical. He is employed by the [employer] and has been there for 20 years. He denies any problems or concerns today.   Past Medical History:  Diagnosis Date  . Hyperlipidemia   . Kidney stones   . Plantar fasciitis   . TV (tinea versicolor)    Past Surgical History:  Procedure Laterality Date  . APPENDECTOMY  01-12-13   No current outpatient medications on file prior to visit.   No current facility-administered medications on file prior to visit.   No Known Allergies  SH: uses smokeless tobacco, occasional ETOH, no drugs, lives with wife and 3 children, feels safe at home, has a second job on days off from [employer].  Diet/Exercise: works out 1 hour/day 3 days a week, tries to eat healthy  Immunizations: has had Covid vaccine x2, did not have flu vaccine this year.   Review of Systems  Constitutional: Positive for fatigue.  All other systems reviewed and are negative.      Objective: BP 134/88   Pulse 62   Temp 97.6 F (36.4 C)   Resp 14   Ht 5\' 9"  (1.753 m)   Wt 215 lb (97.5 kg)   SpO2 98%   BMI 31.75 kg/m     Physical Exam Vitals and nursing note reviewed.  Constitutional:      General: He is not in acute distress.    Appearance: Normal appearance.  HENT:     Head: Normocephalic and atraumatic.     Jaw: There is normal jaw occlusion.     Right Ear: Tympanic membrane, ear canal and external ear normal.     Left Ear: Tympanic membrane, ear canal and external ear normal.     Nose: Nose normal.     Mouth/Throat:     Lips: Pink.     Mouth: Mucous membranes are moist.     Dentition: No gum lesions.     Tongue: No lesions.     Pharynx: Oropharynx is clear. Uvula midline.  Eyes:     General: Lids are normal.     Extraocular Movements: Extraocular movements intact.     Right eye:  No nystagmus.     Left eye: No nystagmus.     Conjunctiva/sclera: Conjunctivae normal.  Neck:     Thyroid: No thyroid mass or thyroid tenderness.     Vascular: Normal carotid pulses. No carotid bruit or JVD.     Trachea: Trachea normal.  Cardiovascular:     Rate and Rhythm: Normal rate and regular rhythm.     Pulses:          Carotid pulses are 2+ on the right side and 2+ on the left side.      Radial pulses are 2+ on the right side and 2+ on the left side.       Dorsalis pedis pulses are 2+ on the right side and 2+ on the left side.     Heart sounds: No murmur heard.   Pulmonary:     Effort: Pulmonary effort is normal.     Breath sounds: Normal breath sounds and air entry.  Abdominal:     General: Bowel sounds are normal.     Palpations: Abdomen is soft.     Tenderness: There is  no abdominal tenderness. There is no right CVA tenderness or left CVA tenderness.  Musculoskeletal:        General: No tenderness. Normal range of motion.     Cervical back: Normal range of motion and neck supple. No pain with movement, spinous process tenderness or muscular tenderness.     Right lower leg: No edema.     Left lower leg: No edema.  Lymphadenopathy:     Cervical: No cervical adenopathy.  Skin:    General: Skin is warm and dry.  Neurological:     Mental Status: He is alert.     Sensory: Sensation is intact.     Motor: Motor function is intact.     Coordination: Romberg sign negative. Coordination normal. Finger-Nose-Finger Test and Heel to [facility] Test normal.     Gait: Gait is intact.     Deep Tendon Reflexes:     Reflex Scores:      Bicep reflexes are 2+ on the right side and 2+ on the left side.      Brachioradialis reflexes are 2+ on the right side and 2+ on the left side.      Patellar reflexes are 2+ on the right side and 2+ on the left side.    Comments: Ambulatory with steady gait, stands on one foot without difficulty. Grips are equal, radial pulses 2+.   Psychiatric:         Mood and Affect: Mood normal.        Behavior: Behavior normal.       Assessment & Plan:  1. Annual physical exam - CBC with Differential/Platelet  Discussed with the patient clinical and lab findings. Patent given the opportunity to ask questions. All questioned fully answered and patient voices understanding. He will return to clinic in one year or sooner if any problems arise.

## 2020-09-26 NOTE — Patient Instructions (Signed)
Health Maintenance, Male Adopting a healthy lifestyle and getting preventive care are important in promoting health and wellness. Ask your health care provider about:  The right schedule for you to have regular tests and exams.  Things you can do on your own to prevent diseases and keep yourself healthy. What should I know about diet, weight, and exercise? Eat a healthy diet  Eat a diet that includes plenty of vegetables, fruits, low-fat dairy products, and lean protein.  Do not eat a lot of foods that are high in solid fats, added sugars, or sodium.   Maintain a healthy weight Body mass index (BMI) is a measurement that can be used to identify possible weight problems. It estimates body fat based on height and weight. Your health care provider can help determine your BMI and help you achieve or maintain a healthy weight. Get regular exercise Get regular exercise. This is one of the most important things you can do for your health. Most adults should:  Exercise for at least 150 minutes each week. The exercise should increase your heart rate and make you sweat (moderate-intensity exercise).  Do strengthening exercises at least twice a week. This is in addition to the moderate-intensity exercise.  Spend less time sitting. Even light physical activity can be beneficial. Watch cholesterol and blood lipids Have your blood tested for lipids and cholesterol at 40 years of age, then have this test every 5 years. You may need to have your cholesterol levels checked more often if:  Your lipid or cholesterol levels are high.  You are older than 40 years of age.  You are at high risk for heart disease. What should I know about cancer screening? Many types of cancers can be detected early and may often be prevented. Depending on your health history and family history, you may need to have cancer screening at various ages. This may include screening for:  Colorectal cancer.  Prostate  cancer.  Skin cancer.  Lung cancer. What should I know about heart disease, diabetes, and high blood pressure? Blood pressure and heart disease  High blood pressure causes heart disease and increases the risk of stroke. This is more likely to develop in people who have high blood pressure readings, are of African descent, or are overweight.  Talk with your health care provider about your target blood pressure readings.  Have your blood pressure checked: ? Every 3-5 years if you are 18-39 years of age. ? Every year if you are 40 years old or older.  If you are between the ages of 65 and 75 and are a current or former smoker, ask your health care provider if you should have a one-time screening for abdominal aortic aneurysm (AAA). Diabetes Have regular diabetes screenings. This checks your fasting blood sugar level. Have the screening done:  Once every three years after age 45 if you are at a normal weight and have a low risk for diabetes.  More often and at a younger age if you are overweight or have a high risk for diabetes. What should I know about preventing infection? Hepatitis B If you have a higher risk for hepatitis B, you should be screened for this virus. Talk with your health care provider to find out if you are at risk for hepatitis B infection. Hepatitis C Blood testing is recommended for:  Everyone born from 1945 through 1965.  Anyone with known risk factors for hepatitis C. Sexually transmitted infections (STIs)  You should be screened each   year for STIs, including gonorrhea and chlamydia, if: ? You are sexually active and are younger than 40 years of age. ? You are older than 40 years of age and your health care provider tells you that you are at risk for this type of infection. ? Your sexual activity has changed since you were last screened, and you are at increased risk for chlamydia or gonorrhea. Ask your health care provider if you are at risk.  Ask your  health care provider about whether you are at high risk for HIV. Your health care provider may recommend a prescription medicine to help prevent HIV infection. If you choose to take medicine to prevent HIV, you should first get tested for HIV. You should then be tested every 3 months for as long as you are taking the medicine. Follow these instructions at home: Lifestyle  Do not use any products that contain nicotine or tobacco, such as cigarettes, e-cigarettes, and chewing tobacco. If you need help quitting, ask your health care provider.  Do not use street drugs.  Do not share needles.  Ask your health care provider for help if you need support or information about quitting drugs. Alcohol use  Do not drink alcohol if your health care provider tells you not to drink.  If you drink alcohol: ? Limit how much you have to 0-2 drinks a day. ? Be aware of how much alcohol is in your drink. In the [place] one drink equals one 12 oz bottle of beer (355 mL), one 5 oz glass of wine (148 mL), or one 1 oz glass of hard liquor (44 mL). General instructions  Schedule regular health, dental, and eye exams.  Stay current with your vaccines.  Tell your health care provider if: ? You often feel depressed. ? You have ever been abused or do not feel safe at home. Summary  Adopting a healthy lifestyle and getting preventive care are important in promoting health and wellness.  Follow your health care provider's instructions about healthy diet, exercising, and getting tested or screened for diseases.  Follow your health care provider's instructions on monitoring your cholesterol and blood pressure. This information is not intended to replace advice given to you by your health care provider. Make sure you discuss any questions you have with your health care provider. Document Revised: 05/04/2018 Document Reviewed: 05/04/2018 Elsevier Patient Education  2021 [place] Years  Old, Male Preventive care refers to lifestyle choices and visits with your health care provider that can promote health and wellness. This includes:  A yearly physical exam. This is also called an annual wellness visit.  Regular dental and eye exams.  Immunizations.  Screening for certain conditions.  Healthy lifestyle choices, such as: ? Eating a healthy diet. ? Getting regular exercise. ? Not using drugs or products that contain nicotine and tobacco. ? Limiting alcohol use. What can I expect for my preventive care visit? Physical exam Your health care provider will check your:  Height and weight. These may be used to calculate your BMI (body mass index). BMI is a measurement that tells if you are at a healthy weight.  Heart rate and blood pressure.  Body temperature.  Skin for abnormal spots. Counseling Your health care provider may ask you questions about your:  Past medical problems.  Family's medical history.  Alcohol, tobacco, and drug use.  Emotional well-being.  Home life and relationship well-being.  Sexual activity.  Diet, exercise, and sleep habits.  Work and work Statistician.  Access to firearms. What immunizations do I need? Vaccines are usually given at various ages, according to a schedule. Your health care provider will recommend vaccines for you based on your age, medical history, and lifestyle or other factors, such as travel or where you work.   What tests do I need? Blood tests  Lipid and cholesterol levels. These may be checked every 5 years, or more often if you are over 28 years old.  Hepatitis C test.  Hepatitis B test. Screening  Lung cancer screening. You may have this screening every year starting at age 10 if you have a 30-pack-year history of smoking and currently smoke or have quit within the past 15 years.  Prostate cancer screening. Recommendations will vary depending on your family history and other risks.  Genital exam  to check for testicular cancer or hernias.  Colorectal cancer screening. ? All adults should have this screening starting at age 48 and continuing until age 6. ? Your health care provider may recommend screening at age 51 if you are at increased risk. ? You will have tests every 1-10 years, depending on your results and the type of screening test.  Diabetes screening. ? This is done by checking your blood sugar (glucose) after you have not eaten for a while (fasting). ? You may have this done every 1-3 years.  STD (sexually transmitted disease) testing, if you are at risk. Follow these instructions at home: Eating and drinking  Eat a diet that includes fresh fruits and vegetables, whole grains, lean protein, and low-fat dairy products.  Take vitamin and mineral supplements as recommended by your health care provider.  Do not drink alcohol if your health care provider tells you not to drink.  If you drink alcohol: ? Limit how much you have to 0-2 drinks a day. ? Be aware of how much alcohol is in your drink. In the [place] one drink equals one 12 oz bottle of beer (355 mL), one 5 oz glass of wine (148 mL), or one 1 oz glass of hard liquor (44 mL).   Lifestyle  Take daily care of your teeth and gums. Brush your teeth every morning and night with fluoride toothpaste. Floss one time each day.  Stay active. Exercise for at least 30 minutes 5 or more days each week.  Do not use any products that contain nicotine or tobacco, such as cigarettes, e-cigarettes, and chewing tobacco. If you need help quitting, ask your health care provider.  Do not use drugs.  If you are sexually active, practice safe sex. Use a condom or other form of protection to prevent STIs (sexually transmitted infections).  If told by your health care provider, take low-dose aspirin daily starting at age 9.  Find healthy ways to cope with stress, such as: ? Meditation, yoga, or listening to  music. ? Journaling. ? Talking to a trusted person. ? Spending time with friends and family. Safety  Always wear your seat belt while driving or riding in a vehicle.  Do not drive: ? If you have been drinking alcohol. Do not ride with someone who has been drinking. ? When you are tired or distracted. ? While texting.  Wear a helmet and other protective equipment during sports activities.  If you have firearms in your house, make sure you follow all gun safety procedures. What's next?  Go to your health care provider once a year for an annual wellness visit.  Ask your health  care provider how often you should have your eyes and teeth checked.  Stay up to date on all vaccines. This information is not intended to replace advice given to you by your health care provider. Make sure you discuss any questions you have with your health care provider. Document Revised: 02/07/2019 Document Reviewed: 05/05/2018 Elsevier Patient Education  2021 [employer].  Immunization Schedule, 60-65 Years Old  Vaccines are usually given at various ages, according to a schedule. Your health care provider will recommend vaccines for you based on your age, medical history, and lifestyle or other factors, such as travel or where you work. You may receive vaccines as individual doses or as more than one vaccine together in one shot (combination vaccines). Talk with your health care provider about the risks and benefits of combination vaccines. Recommended immunizations for 84-1 years old Influenza vaccine  You should get a dose of the influenza vaccine every year. Tetanus, diphtheria, and pertussis vaccine A vaccine that protects against tetanus, diphtheria, and pertussis is known as the Tdap vaccine. A vaccine that protects against tetanus and diphtheria is known as the Td vaccine.  You should only get the Td vaccine if you have had at least 1 dose of the Tdap vaccine.  You should get 1 dose of the Td  or Tdap vaccine every 10 years, or you should get 1 dose of the Tdap vaccine if: ? You have not previously gotten a Tdap vaccine. ? You do not know if you have ever gotten a Tdap vaccine. ? You are between 27 and [redacted] weeks pregnant. Measles, mumps, and rubella vaccine This is also known as the MMR vaccine. You may need to get the MMR vaccine if:  You need to catch up on doses you missed in the past.  You have not been given the vaccine before.  You do not have evidence of immunity (by a blood test). Pregnant women should not get the MMR vaccine during pregnancy because it may be harmful to the unborn baby. However, if you are not immune to measles, mumps, or rubella, you should get a dose of MMR vaccine one month or more before pregnancy or within days after delivery. Varicella vaccine This is also known as the VAR vaccine. You may need to get the VAR vaccine if you were born in 1980 or later and:  You need to catch up on doses you missed in the past.  You have not been given the vaccine before.  You do not have evidence of immunity (by a blood test).  You have certain high-risk conditions, such as HIV or AIDS. Pregnant women should not get the VAR vaccine during pregnancy because it may be harmful to the unborn baby. However, if you are not immune to chickenpox (varicella), you should get a dose of the VAR vaccine within days after delivery. Human papillomavirus vaccine This is also known as the HPV vaccine. If you have not gotten the vaccine before or you missed doses in the past, talk to your health care provider about whether it is appropriate for you to get the HPV vaccine. Pneumococcal conjugate vaccine This is also known as the PCV13 vaccine. You should get the PCV13 vaccine as recommended if you have certain high-risk conditions. These include:  Diabetes.  Chronic conditions of the heart, lungs, or liver.  Conditions that affect the body's disease-fighting system (immune  system). Pneumococcal polysaccharide vaccine This is also known as the PPSV23 vaccine. You should get the PPSV23 vaccine  as recommended if you have certain high-risk conditions. These include:  Diabetes.  Chronic conditions of the heart, lungs, or liver.  Conditions that affect the immune system. Hepatitis A vaccine This is also known as the HepA vaccine. If you did not get the HepA vaccine previously, you should get it if:  You are at risk for a hepatitis A infection. You may be at risk for infection if you: ? Have chronic liver disease. ? Have HIV or AIDS. ? Are a man who has sex with men. ? Use drugs. ? Are homeless. ? May be exposed to hepatitis A through work. ? Travel to countries where hepatitis A is common. ? Are pregnant. ? Have or will have close contact with someone who was adopted from another country.  You are not at risk for infection but want protection from hepatitis A. Hepatitis B vaccine This is also known as the HepB vaccine. If you did not get the HepB vaccine previously, you should get it if:  You are at risk for hepatitis B infection. You are at risk if you: ? Have chronic liver disease. ? Have HIV or AIDS. ? Have sex with a partner who has hepatitis B, or:  You have multiple sex partners.  You are a man who has sex with men. ? Use drugs. ? May be exposed to hepatitis B through work. ? Live with someone who has hepatitis B. ? Receive dialysis treatment. ? Have diabetes. ? Travel to countries where hepatitis B is common. ? Are pregnant.  You are not at risk of infection but want protection from hepatitis B. Meningococcal conjugate vaccine This is also known as the MenACWY vaccine. You may need to get the MenACWY vaccine if you:  Have not been given the vaccine before.  Need to catch up on doses you missed in the past. This vaccine is especially important if you:  Do not have a spleen.  Have sickle cell disease.  Have HIV.  Take medicines  that suppress your immune system.  Travel to countries where meningococcal disease is common.  Are exposed to Neisseria meningitidis at work. Serogroup B meningococcal vaccine This is also known as the MenB vaccine. You may need to get the MenB vaccine if you:  Have not been given the vaccine before.  Need to catch up on doses you missed in the past. This vaccine is especially important if you:  Do not have a spleen.  Have sickle cell disease.  Take medicines that suppress your immune system.  Are exposed to Neisseria meningitidis at work. Haemophilus influenzae type b vaccine This is also known as the Hib vaccine. Anyone older than 40 years of age is usually not given the Hib vaccine. However, if you have certain high-risk conditions, you may need to get this vaccine. These conditions include:  Not having a spleen.  Having received a stem cell transplant. Before you get a vaccine: Talk with your health care provider about which vaccines are right for you. This is especially important if:  You previously had a reaction after getting a vaccine.  You have a weakened immune system. You may have a weakened immune system if you: ? Are taking medicines that reduce (suppress) the activity of your immune system. ? Are taking medicines to treat cancer (chemotherapy). ? Have HIV or AIDS.  You work in an environment where you may be exposed to a disease.  You plan to travel outside of the country.  You have a chronic  illness, such as heart disease, kidney disease, diabetes, or lung disease.  You are pregnant, think you may be pregnant, or are planning to become pregnant. Summary  Before you get a vaccine, tell your health care provider if you have reacted to vaccines in the past or have a condition that weakens your immune system.  At 27-49 years, you should get a dose of the influenza vaccine every year and a dose of the Td or Tdap vaccine every 10 years.  Depending on your  medical history and your risk factors, you may need other vaccines. Ask your health care provider whether you are up to date on all your vaccines.  Women who are pregnant may not receive certain vaccines. Ask your health care provider whether you should receive any vaccines soon after you deliver your baby. This information is not intended to replace advice given to you by your health care provider. Make sure you discuss any questions you have with your health care provider. Document Revised: 03/08/2019 Document Reviewed: 03/08/2019 Elsevier Patient Education  2021 [employer].

## 2020-09-27 LAB — CBC WITH DIFFERENTIAL/PLATELET
Basophils Absolute: 0 10*3/uL (ref 0.0–0.2)
Basos: 1 %
EOS (ABSOLUTE): 0.2 10*3/uL (ref 0.0–0.4)
Eos: 4 %
Hematocrit: 47.7 % (ref 37.5–51.0)
Hemoglobin: 16.6 g/dL (ref 13.0–17.7)
Immature Grans (Abs): 0 10*3/uL (ref 0.0–0.1)
Immature Granulocytes: 1 %
Lymphocytes Absolute: 1 10*3/uL (ref 0.7–3.1)
Lymphs: 24 %
MCH: 29.1 pg (ref 26.6–33.0)
MCHC: 34.8 g/dL (ref 31.5–35.7)
MCV: 84 fL (ref 79–97)
Monocytes Absolute: 0.4 10*3/uL (ref 0.1–0.9)
Monocytes: 9 %
Neutrophils Absolute: 2.8 10*3/uL (ref 1.4–7.0)
Neutrophils: 61 %
Platelets: 177 10*3/uL (ref 150–450)
RBC: 5.71 x10E6/uL (ref 4.14–5.80)
RDW: 12 % (ref 11.6–15.4)
WBC: 4.4 10*3/uL (ref 3.4–10.8)

## 2021-02-06 DIAGNOSIS — M9902 Segmental and somatic dysfunction of thoracic region: Secondary | ICD-10-CM | POA: Diagnosis not present

## 2021-02-06 DIAGNOSIS — M9901 Segmental and somatic dysfunction of cervical region: Secondary | ICD-10-CM | POA: Diagnosis not present

## 2021-02-06 DIAGNOSIS — M5383 Other specified dorsopathies, cervicothoracic region: Secondary | ICD-10-CM | POA: Diagnosis not present

## 2021-08-30 DIAGNOSIS — G44201 Tension-type headache, unspecified, intractable: Secondary | ICD-10-CM | POA: Diagnosis not present

## 2021-09-09 ENCOUNTER — Ambulatory Visit: Payer: 59

## 2021-09-09 ENCOUNTER — Ambulatory Visit: Payer: Self-pay

## 2021-09-09 VITALS — Resp 14 | Ht 69.0 in | Wt 220.0 lb

## 2021-09-09 DIAGNOSIS — Z0000 Encounter for general adult medical examination without abnormal findings: Secondary | ICD-10-CM

## 2021-09-09 LAB — POCT URINALYSIS DIPSTICK
Bilirubin, UA: NEGATIVE
Blood, UA: NEGATIVE
Glucose, UA: NEGATIVE
Ketones, UA: NEGATIVE
Leukocytes, UA: NEGATIVE
Nitrite, UA: NEGATIVE
Protein, UA: NEGATIVE
Spec Grav, UA: >=1.03 — AB (ref 1.010–1.025)
Urobilinogen, UA: 0.2 E.U./dL
pH, UA: 6 (ref 5.0–8.0)

## 2021-09-09 NOTE — Progress Notes (Signed)
Pt presents today for physical labs, will return to clinic for scheduled physical.  

## 2021-09-10 LAB — CMP12+LP+TP+TSH+6AC+PSA+CBC…
ALT: 40 IU/L (ref 0–44)
AST: 22 IU/L (ref 0–40)
Albumin/Globulin Ratio: 2 (ref 1.2–2.2)
Albumin: 4.6 g/dL (ref 4.0–5.0)
Alkaline Phosphatase: 88 IU/L (ref 44–121)
BUN/Creatinine Ratio: 13 (ref 9–20)
BUN: 12 mg/dL (ref 6–24)
Basophils Absolute: 0 10*3/uL (ref 0.0–0.2)
Basos: 1 %
Bilirubin Total: 0.9 mg/dL (ref 0.0–1.2)
Calcium: 9.4 mg/dL (ref 8.7–10.2)
Chloride: 103 mmol/L (ref 96–106)
Chol/HDL Ratio: 5.2 ratio — ABNORMAL HIGH (ref 0.0–5.0)
Cholesterol, Total: 204 mg/dL — ABNORMAL HIGH (ref 100–199)
Creatinine, Ser: 0.9 mg/dL (ref 0.76–1.27)
EOS (ABSOLUTE): 0.2 10*3/uL (ref 0.0–0.4)
Eos: 4 %
Estimated CHD Risk: 1.1 times avg. — ABNORMAL HIGH (ref 0.0–1.0)
Free Thyroxine Index: 2.6 (ref 1.2–4.9)
GGT: 23 IU/L (ref 0–65)
Globulin, Total: 2.3 g/dL (ref 1.5–4.5)
Glucose: 84 mg/dL (ref 70–99)
HDL: 39 mg/dL — ABNORMAL LOW (ref >39)
Hematocrit: 47.6 % (ref 37.5–51.0)
Hemoglobin: 16.8 g/dL (ref 13.0–17.7)
Immature Grans (Abs): 0 10*3/uL (ref 0.0–0.1)
Immature Granulocytes: 0 %
Iron: 136 ug/dL (ref 38–169)
LDH: 157 IU/L (ref 121–224)
LDL Chol Calc (NIH): 134 mg/dL — ABNORMAL HIGH (ref 0–99)
Lymphocytes Absolute: 1.5 10*3/uL (ref 0.7–3.1)
Lymphs: 28 %
MCH: 29.6 pg (ref 26.6–33.0)
MCHC: 35.3 g/dL (ref 31.5–35.7)
MCV: 84 fL (ref 79–97)
Monocytes Absolute: 0.3 10*3/uL (ref 0.1–0.9)
Monocytes: 6 %
Neutrophils Absolute: 3.2 10*3/uL (ref 1.4–7.0)
Neutrophils: 61 %
Phosphorus: 3 mg/dL (ref 2.8–4.1)
Platelets: 203 10*3/uL (ref 150–450)
Potassium: 4.2 mmol/L (ref 3.5–5.2)
Prostate Specific Ag, Serum: 0.6 ng/mL (ref 0.0–4.0)
RBC: 5.68 x10E6/uL (ref 4.14–5.80)
RDW: 12.4 % (ref 11.6–15.4)
Sodium: 141 mmol/L (ref 134–144)
T3 Uptake Ratio: 35 % (ref 24–39)
T4, Total: 7.3 ug/dL (ref 4.5–12.0)
TSH: 1.74 u[IU]/mL (ref 0.450–4.500)
Total Protein: 6.9 g/dL (ref 6.0–8.5)
Triglycerides: 173 mg/dL — ABNORMAL HIGH (ref 0–149)
Uric Acid: 6.6 mg/dL (ref 3.8–8.4)
VLDL Cholesterol Cal: 31 mg/dL (ref 5–40)
WBC: 5.2 10*3/uL (ref 3.4–10.8)
eGFR: 111 mL/min/{1.73_m2} (ref >59)

## 2021-09-18 ENCOUNTER — Encounter: Payer: Self-pay | Admitting: Physician Assistant

## 2021-09-18 ENCOUNTER — Ambulatory Visit: Payer: Self-pay | Admitting: Physician Assistant

## 2021-09-18 ENCOUNTER — Ambulatory Visit: Payer: Self-pay

## 2021-09-18 VITALS — BP 130/84 | HR 77 | Temp 97.5°F | Resp 16 | Ht 69.0 in | Wt 226.0 lb

## 2021-09-18 DIAGNOSIS — Z0000 Encounter for general adult medical examination without abnormal findings: Secondary | ICD-10-CM

## 2021-09-18 DIAGNOSIS — Z0289 Encounter for other administrative examinations: Secondary | ICD-10-CM

## 2021-09-18 MED ORDER — TERBINAFINE HCL 1 % EX CREA
1.0000 | TOPICAL_CREAM | Freq: Two times a day (BID) | CUTANEOUS | 0 refills | Status: DC
Start: 2021-09-18 — End: 2023-10-27

## 2021-09-18 NOTE — Progress Notes (Signed)
? ?[facility] ? ?____________________________________________ ? ? None  ?  (approximate) ? ?I have reviewed the triage vital signs and the nursing notes. ? ? ?HISTORY ? 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  Patient with no complaints or concerns.  Patient requests prescription for Lamisil for tinea condition.  No ?   ? ?  ? ? ?Past Medical History:  ?Diagnosis Date  ? Hyperlipidemia   ? Kidney stones   ? Plantar fasciitis   ? TV (tinea versicolor)   ? ? ?Patient Active Problem List  ? Diagnosis Date Noted  ? Fever, unspecified 02/06/2013  ? Acute appendicitis without mention of peritonitis 01/26/2013  ? ? ?Past Surgical History:  ?Procedure Laterality Date  ? APPENDECTOMY  01-12-13  ? ? ?Prior to Admission medications   ?Medication Sig Start Date End Date Taking? Authorizing Provider  ?terbinafine (LAMISIL AT) 1 % cream Apply 1 application. topically 2 (two) times daily. 09/18/21  Yes Sable Feil, PA-C  ? ? ?Allergies ?Patient has no known allergies. ? ?Family History  ?Problem Relation Age of Onset  ? Hypertension Father   ? Leukemia Father   ? Penile cancer Father   ? Parkinson's disease Paternal Grandmother   ? ? ?Social History ?Social History  ? ?Tobacco Use  ? Smoking status: Never  ? Smokeless tobacco: Current  ?  Types: Snuff  ?Substance Use Topics  ? Alcohol use: Yes  ? Drug use: No  ? ? ?Review of Systems ?Constitutional: No fever/chills ?Eyes: No visual changes. ?ENT: No sore throat. ?Cardiovascular: Denies chest pain. ?Respiratory: Denies shortness of breath. ?Gastrointestinal: No abdominal pain.  No nausea, no vomiting.  No diarrhea.  No constipation. ?Genitourinary: Negative for dysuria. ?Musculoskeletal: Negative for back pain. ?Skin: Negative for rash. ?Neurological: Negative for headaches, focal weakness or  numbness. ? ?____________________________________________ ? ? ?PHYSICAL EXAM: ? ?VITAL SIGNS: Temperature 97.5, respiration 16, pulse 77, and BP is 130/84.  Patient 98% O2 sat on room air.  Patient weighs 226 pounds and BMI is 33.37. ?Constitutional: Alert and oriented. Well appearing and in no acute distress. ?Eyes: Conjunctivae are normal. PERRL. EOMI. ?Head: Atraumatic. ?Nose: No congestion/rhinnorhea. ?Mouth/Throat: Mucous membranes are moist.  Oropharynx non-erythematous. ?Neck: No stridor.  No cervical spine tenderness to palpation. ?Hematological/Lymphatic/Immunilogical: No cervical lymphadenopathy. ?Cardiovascular: Normal rate, regular rhythm. Grossly normal heart sounds.  Good peripheral circulation. ?Respiratory: Normal respiratory effort.  No retractions. Lungs CTAB. ?Gastrointestinal: Soft and nontender. No distention. No abdominal bruits. No CVA tenderness. ?Genitourinary: Deferred ?Musculoskeletal: No lower extremity tenderness nor edema.  No joint effusions. ?Neurologic:  Normal speech and language. No gross focal neurologic deficits are appreciated. No gait instability. ?Skin:  Skin is warm, dry and intact. No rash noted. ?Psychiatric: Mood and affect are normal. Speech and behavior are normal. ? ?____________________________________________ ?  ?LABS ? ?__ ? Ref Range & Units 9 d ago ?(09/09/21) 12 mo ago ?(09/20/20) 2 yr ago ?(09/05/19) 2 yr ago ?(04/18/19)  ?Color, UA  dark yellow  Dark Yellow  Dark Yellow  Dark [place]   ?Clarity, UA  clear  Clear  Clear  clear   ?Glucose, UA Negative Negative  Negative  Negative  Negative   ?Bilirubin, UA  negative  Negative  Negative  Negative   ?Ketones, UA  negative  Negative  Negative  Negative   ?Spec Grav, UA 1.010 - 1.025 >=1.030 Abnormal   >=1.030  Abnormal   1.025  1.025   ?Blood, UA  negative  Negative  Negative  Negative   ?pH, UA 5.0 - 8.0 6.0  6.0  6.0  6.0   ?Protein, UA Negative Negative  Negative  Negative  Negative   ?Urobilinogen, UA 0.2 or 1.0  E.U./dL 0.2  0.2  0.2  0.2   ?Nitrite, UA  negative  Negative  Negative  Negative   ?Leukocytes, UA Negative Negative  Negative  Negative  Negative   ?Appearance          ?Odor          ?  ? ?  ?  ?Specimen Collected: 09/09/21 10:10 Last Resulted: 09/09/21 10:10  ?  ?    ?View Encounter Conversation    ?  ?  ? ?  ?  ? ?Other Results from 09/09/2021 ? ? Contains abnormal data CMP12+LP+TP+TSH+6AC+PSA+CBC? ?Order: [id number] ?Status: Final result    ?Visible to patient: Yes (seen)    ?Next appt: None    ?Dx: Annual physical exam    ?0 Result Notes ?          ?Component Ref Range & Units 9 d ago ?(09/09/21) 11 mo ago ?(09/26/20) 12 mo ago ?(09/20/20) 2 yr ago ?(09/05/19) 2 yr ago ?(04/18/19) 8 yr ago ?(02/02/13) 8 yr ago ?(02/02/13)  ?Glucose 70 - 99 mg/dL 84   84 R  90 R  85 R  111 High  R    ?Uric Acid 3.8 - 8.4 mg/dL 6.6   6.9 CM  6.2 CM  7.0 R, CM     ?Comment:            Therapeutic target for gout patients: <6.0  ?BUN 6 - 24 mg/dL '12   13  11 ' R  12 R  9 R    ?Creatinine, Ser 0.76 - 1.27 mg/dL 0.90   0.91  0.92  0.90  0.93 R    ?eGFR >59 mL/min/1.73 111   109       ?BUN/Creatinine Ratio 9 - '20 13   14  12  13     ' ?Sodium 134 - 144 mmol/L 141   140  139  140  139 R    ?Potassium 3.5 - 5.2 mmol/L 4.2   4.3  4.3  4.5  3.5 R    ?Chloride 96 - 106 mmol/L 103   105  104  103  101 R    ?Calcium 8.7 - 10.2 mg/dL 9.4   9.3  9.6  9.7  9.1 R    ?Phosphorus 2.8 - 4.1 mg/dL 3.0   3.0  3.6  3.6     ?Total Protein 6.0 - 8.5 g/dL 6.9   6.6  6.8  6.9  7.6 R    ?Albumin 4.0 - 5.0 g/dL 4.6   4.9  4.5  4.7  3.5 R    ?Globulin, Total 1.5 - 4.5 g/dL 2.3   1.7  2.3  2.2     ?Albumin/Globulin Ratio 1.2 - 2.2 2.0   2.9 High   2.0  2.1     ?Bilirubin Total 0.0 - 1.2 mg/dL 0.9   0.8  0.7  0.5  1.2 High  R    ?Alkaline Phosphatase 44 - 121 IU/L 88   82  81 R  84 R  130 R    ?LDH 121 - 224 IU/L 157   195  141  196     ?AST 0 - 40  IU/L '22   20  19  19  21 ' R    ?ALT 0 - 44 IU/L 40   '29  20  21  ' 41 R    ?GGT 0 - 65 IU/L '23   13  13  14     ' ?Iron 38 - 169  ug/dL 136   99  111  74     ?Cholesterol, Total 100 - 199 mg/dL 204 High    176  187  174     ?Triglycerides 0 - 149 mg/dL 173 High    107  121  134     ?HDL >39 mg/dL 39 Low    42  44  39 Low      ?VLDL Cholesterol Cal 5 - 40 mg/dL '31   20  22  24     ' ?LDL Chol Calc (NIH) 0 - 99 mg/dL 134 High    114 High   121 High   111 High      ?Chol/HDL Ratio 0.0 - 5.0 ratio 5.2 High    4.2 CM  4.3 CM  4.5 CM     ?Comment:                                   T. Chol/HDL Ratio  ?                                            Men  Women  ?                              1/2 Avg.Risk  3.4    3.3  ?                                  Avg.Risk  5.0    4.4  ?                               2X Avg.Risk  9.6    7.1  ?                               3X Avg.Risk 23.4   11.0   ?Estimated CHD Risk 0.0 - 1.0 times avg. 1.1 High    0.8 CM  0.8 CM  0.9 CM     ?Comment: The CHD Risk is based on the T. Chol/HDL ratio. Other  ?factors affect CHD Risk such as hypertension, smoking,  ?diabetes, severe obesity, and family history of  ?premature CHD.   ?TSH 0.450 - 4.500 uIU/mL 1.740   1.990  1.880  1.850     ?T4, Total 4.5 - 12.0 ug/dL 7.3   6.5  5.1  5.4     ?T3 Uptake Ratio 24 - 39 % 35   33  30  32     ?Free Thyroxine Index 1.2 - 4.9 2.6   2.1  1.5  1.7     ?Prostate Specific Ag, Serum 0.0 - 4.0 ng/mL 0.6   0.6 CM  0.7 CM      ?Comment: Roche ECLIA methodology.  ?  According to the American Urological Association, Serum PSA should  ?decrease and remain at undetectable levels after radical  ?prostatectomy. The AUA defines biochemical recurrence as an initial  ?PSA value 0.2 ng/mL or greater followed by a subsequent confirmatory  ?PSA value 0.2 ng/mL or greater.  ?Values obtained with different assay methods or kits cannot be used  ?interchangeably. Results cannot be interpreted as absolute evidence  ?of the presence or absence of malignant disease.   ?WBC 3.4 - 10.8 x10E3/uL 5.2  4.4  CANCELED R, CM  4.8  4.7   9.6 R   ?RBC 4.14 - 5.80 x10E6/uL 5.68  5.71   CANCELED R, CM  5.43  5.53   4.78 R   ?Hemoglobin 13.0 - 17.7 g/dL 16.8  16.6  CANCELED R, CM  16.1  16.0     ?Hematocrit 37.5 - 51.0 % 47.6  47.7  CANCELED R, CM  46.8  47.5     ?MCV 79 - 97 fL 84  84  CANCELED R, CM  86  86   83 R   ?MCH 26.6 - 3

## 2021-09-18 NOTE — Addendum Note (Signed)
Addended by: Gardner Candle on: 09/18/2021 10:18 AM ? ? Modules accepted: Orders ? ?

## 2022-09-02 ENCOUNTER — Other Ambulatory Visit: Payer: Self-pay | Admitting: Physician Assistant

## 2022-09-02 DIAGNOSIS — J069 Acute upper respiratory infection, unspecified: Secondary | ICD-10-CM

## 2022-09-02 LAB — POC COVID19 BINAXNOW: SARS Coronavirus 2 Ag: POSITIVE — AB

## 2022-09-02 MED ORDER — BENZONATATE 200 MG PO CAPS: 200.0000 mg | ORAL_CAPSULE | Freq: Two times a day (BID) | ORAL | 0 refills | Status: DC | PRN

## 2022-09-02 MED ORDER — FEXOFENADINE-PSEUDOEPHED ER 60-120 MG PO TB12: 1.0000 | ORAL_TABLET | Freq: Two times a day (BID) | ORAL | 0 refills | Status: DC

## 2022-09-02 NOTE — Progress Notes (Signed)
3 days of cough, drainage and congestion.

## 2022-09-02 NOTE — Progress Notes (Signed)
   Subjective: Cough and congestion    Patient ID: Brett Frank, male    DOB: 07-Oct-1980, 42 y.o.   MRN: [id number]  HPI Patient complain of cough and nasal congestion for 3 days.  Denies recent travel or known contact with COVID-19.  Patient did test positive for COVID-19 5 to 30 minutes ago.  No fever associated with complaint.   Review of Systems Negative except for chief complaint    Objective:   Physical Exam Physical exam deferred secondary to this being a telephonic encounter.       Assessment & Plan: COVID-19   Patient given a prescription for Allegra-D and Tessalon Perles.  Patient advised to follow new CDC recommendation for COVID-19.  Patient voiced understanding will comply with mask wearing for the next 5 days.

## 2022-09-08 ENCOUNTER — Encounter: Payer: 59 | Admitting: Physician Assistant

## 2022-09-10 ENCOUNTER — Other Ambulatory Visit: Payer: Self-pay | Admitting: Physician Assistant

## 2022-09-10 MED ORDER — BENZONATATE 200 MG PO CAPS: 200.0000 mg | ORAL_CAPSULE | Freq: Two times a day (BID) | ORAL | 0 refills | Status: DC | PRN

## 2022-09-22 ENCOUNTER — Ambulatory Visit: Payer: Self-pay

## 2022-09-22 DIAGNOSIS — Z0289 Encounter for other administrative examinations: Secondary | ICD-10-CM

## 2022-09-22 LAB — POCT URINALYSIS DIPSTICK
Bilirubin, UA: NEGATIVE
Blood, UA: NEGATIVE
Glucose, UA: NEGATIVE
Ketones, UA: NEGATIVE
Leukocytes, UA: NEGATIVE
Nitrite, UA: NEGATIVE
Protein, UA: NEGATIVE
Spec Grav, UA: 1.02 (ref 1.010–1.025)
Urobilinogen, UA: 0.2 E.U./dL
pH, UA: 6 (ref 5.0–8.0)

## 2022-09-22 NOTE — Progress Notes (Signed)
Pt completed labs for annual fire physical./CL,RMA

## 2022-09-23 LAB — CMP12+LP+TP+TSH+6AC+PSA+CBC…
ALT: 29 IU/L (ref 0–44)
AST: 24 IU/L (ref 0–40)
Albumin/Globulin Ratio: 1.8 (ref 1.2–2.2)
Albumin: 4.2 g/dL (ref 4.1–5.1)
Alkaline Phosphatase: 99 IU/L (ref 44–121)
BUN/Creatinine Ratio: 11 (ref 9–20)
BUN: 9 mg/dL (ref 6–24)
Basophils Absolute: 0 10*3/uL (ref 0.0–0.2)
Basos: 1 %
Bilirubin Total: 0.5 mg/dL (ref 0.0–1.2)
Calcium: 9.5 mg/dL (ref 8.7–10.2)
Chloride: 104 mmol/L (ref 96–106)
Chol/HDL Ratio: 4.9 ratio (ref 0.0–5.0)
Cholesterol, Total: 185 mg/dL (ref 100–199)
Creatinine, Ser: 0.84 mg/dL (ref 0.76–1.27)
EOS (ABSOLUTE): 0.2 10*3/uL (ref 0.0–0.4)
Eos: 4 %
Estimated CHD Risk: 1 times avg. (ref 0.0–1.0)
Free Thyroxine Index: 1.7 (ref 1.2–4.9)
GGT: 19 IU/L (ref 0–65)
Globulin, Total: 2.3 g/dL (ref 1.5–4.5)
Glucose: 88 mg/dL (ref 70–99)
HDL: 38 mg/dL — ABNORMAL LOW (ref >39)
Hematocrit: 46.3 % (ref 37.5–51.0)
Hemoglobin: 15.5 g/dL (ref 13.0–17.7)
Immature Grans (Abs): 0 10*3/uL (ref 0.0–0.1)
Immature Granulocytes: 0 %
Iron: 113 ug/dL (ref 38–169)
LDH: 137 IU/L (ref 121–224)
LDL Chol Calc (NIH): 109 mg/dL — ABNORMAL HIGH (ref 0–99)
Lymphocytes Absolute: 1.3 10*3/uL (ref 0.7–3.1)
Lymphs: 31 %
MCH: 28.8 pg (ref 26.6–33.0)
MCHC: 33.5 g/dL (ref 31.5–35.7)
MCV: 86 fL (ref 79–97)
Monocytes Absolute: 0.3 10*3/uL (ref 0.1–0.9)
Monocytes: 7 %
Neutrophils Absolute: 2.4 10*3/uL (ref 1.4–7.0)
Neutrophils: 57 %
Phosphorus: 3.6 mg/dL (ref 2.8–4.1)
Platelets: 260 10*3/uL (ref 150–450)
Potassium: 4.4 mmol/L (ref 3.5–5.2)
Prostate Specific Ag, Serum: 1.1 ng/mL (ref 0.0–4.0)
RBC: 5.38 x10E6/uL (ref 4.14–5.80)
RDW: 11.9 % (ref 11.6–15.4)
Sodium: 142 mmol/L (ref 134–144)
T3 Uptake Ratio: 32 % (ref 24–39)
T4, Total: 5.3 ug/dL (ref 4.5–12.0)
TSH: 1.89 u[IU]/mL (ref 0.450–4.500)
Total Protein: 6.5 g/dL (ref 6.0–8.5)
Triglycerides: 218 mg/dL — ABNORMAL HIGH (ref 0–149)
Uric Acid: 6 mg/dL (ref 3.8–8.4)
VLDL Cholesterol Cal: 38 mg/dL (ref 5–40)
WBC: 4.1 10*3/uL (ref 3.4–10.8)
eGFR: 112 mL/min/{1.73_m2} (ref >59)

## 2022-09-29 ENCOUNTER — Encounter: Payer: Self-pay | Admitting: Physician Assistant

## 2022-09-29 ENCOUNTER — Ambulatory Visit: Payer: Self-pay | Admitting: Physician Assistant

## 2022-09-29 VITALS — BP 148/86 | HR 89 | Resp 16 | Ht 69.0 in

## 2022-09-29 DIAGNOSIS — B354 Tinea corporis: Secondary | ICD-10-CM

## 2022-09-29 DIAGNOSIS — Z0289 Encounter for other administrative examinations: Secondary | ICD-10-CM

## 2022-09-29 MED ORDER — CLOTRIMAZOLE-BETAMETHASONE 1-0.05 % EX CREA: 1.0000 | TOPICAL_CREAM | Freq: Every day | CUTANEOUS | 0 refills | Status: DC

## 2022-09-29 NOTE — Progress Notes (Signed)
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

## 2022-09-29 NOTE — Progress Notes (Signed)
[facility] ____________________________________________   None    (approximate)  I have reviewed the triage vital signs and the nursing notes.   HISTORY  Chief Complaint No chief complaint on file.  HPI Brett Frank is a 42 y.o. male patient presents for annual physical exam.  Patient most concerned for rash that waxes and wanes to the left lateral back for over a month.  Patient states seems to improve antifungal cream but comes back after stopped using the cream.  Mild itching.        Past Medical History:  Diagnosis Date   Hyperlipidemia    Kidney stones    Plantar fasciitis    TV (tinea versicolor)     Patient Active Problem List   Diagnosis Date Noted   Fever, unspecified 02/06/2013   Acute appendicitis without mention of peritonitis 01/26/2013    Past Surgical History:  Procedure Laterality Date   APPENDECTOMY  01-12-13    Prior to Admission medications   Medication Sig Start Date End Date Taking? Authorizing Provider  benzonatate (TESSALON) 200 MG capsule Take 1 capsule (200 mg total) by mouth 2 (two) times daily as needed for cough. 09/10/22   Joni Reining, PA-C  fexofenadine-pseudoephedrine (ALLEGRA-D) 60-120 MG 12 hr tablet Take 1 tablet by mouth 2 (two) times daily. 09/02/22   Joni Reining, PA-C  terbinafine (LAMISIL AT) 1 % cream Apply 1 application. topically 2 (two) times daily. 09/18/21   Joni Reining, PA-C    Allergies Patient has no known allergies.  Family History  Problem Relation Age of Onset   Hypertension Father    Leukemia Father    Penile cancer Father    Parkinson's disease Paternal Grandmother     Social History Social History   Tobacco Use   Smoking status: Never   Smokeless tobacco: Current    Types: Snuff  Substance Use Topics   Alcohol use: Yes   Drug use: No    Review of Systems  Constitutional: No fever/chills Eyes: No visual changes. ENT: No sore  throat. Cardiovascular: Denies chest pain. Respiratory: Denies shortness of breath. Gastrointestinal: No abdominal pain.  No nausea, no vomiting.  No diarrhea.  No constipation. Genitourinary: Negative for dysuria. Musculoskeletal: Negative for back pain. Skin: Positive for rash. Neurological: Negative for headaches, focal weakness or numbness.   ____________________________________________   PHYSICAL EXAM:  VITAL SIGNS:  09/29/2022 10:04 AM  BP 146/98  Pulse 89  Resp 16  Height 5\' 9"  (1.753 m)   Other Vitals  BMI 33.37 kg/m2  BSA 2.23 m2    Recheck blood pressure reading 140/86. Constitutional: Alert and oriented. Well appearing and in no acute distress. Eyes: Conjunctivae are normal. PERRL. EOMI. Head: Atraumatic. Nose: No congestion/rhinnorhea. Mouth/Throat: Mucous membranes are moist.  Oropharynx non-erythematous. Neck: No stridor.  No cervical spine tenderness to palpation. Hematological/Lymphatic/Immunilogical: No cervical lymphadenopathy. Cardiovascular: Normal rate, regular rhythm. Grossly normal heart sounds.  Good peripheral circulation. Respiratory: Normal respiratory effort.  No retractions. Lungs CTAB. Gastrointestinal: Soft and nontender. No distention. No abdominal bruits. No CVA tenderness. Genitourinary: Deferred Musculoskeletal: No lower extremity tenderness nor edema.  No joint effusions. Neurologic:  Normal speech and language. No gross focal neurologic deficits are appreciated. No gait instability. Skin:  Skin is warm, dry and intact.  Macular over lesion with scaly borders left lateral back Psychiatric: Mood and affect are normal. Speech and behavior are normal.  ____________________________________________   LABS  __  Component Ref Range & Units 7 d ago (09/22/22) 1 yr ago (09/09/21) 2 yr ago (09/20/20) 3 yr ago (09/05/19) 3 yr ago (04/18/19)  Color, UA yellow dark yellow Dark Yellow Dark Yellow Dark [place]  Clarity, UA clear clear Clear  Clear clear  Glucose, UA Negative Negative Negative Negative Negative Negative  Bilirubin, UA neg negative Negative Negative Negative  Ketones, UA neg negative Negative Negative Negative  Spec Grav, UA 1.010 - 1.025 1.020 >=1.030 Abnormal  >=1.030 Abnormal  1.025 1.025  Blood, UA neg negative Negative Negative Negative  pH, UA 5.0 - 8.0 6.0 6.0 6.0 6.0 6.0  Protein, UA Negative Negative Negative Negative Negative Negative  Urobilinogen, UA 0.2 or 1.0 E.U./dL 0.2 0.2 0.2 0.2 0.2  Nitrite, UA neg negative Negative Negative Negative  Leukocytes, UA Negative Negative Negative Negative Negative Negative  Appearance       Odor                         Component Ref Range & Units 7 d ago (09/22/22) 1 yr ago (09/09/21) 2 yr ago (09/26/20) 2 yr ago (09/20/20) 3 yr ago (09/05/19) 3 yr ago (04/18/19) 9 yr ago (02/02/13) 9 yr ago (02/02/13)  Glucose 70 - 99 mg/dL 88 84  84 R 90 R 85 R 098 High  R   Uric Acid 3.8 - 8.4 mg/dL 6.0 6.6 CM  6.9 CM 6.2 CM 7.0 R, CM    Comment:            Therapeutic target for gout patients: <6.0  BUN 6 - 24 mg/dL 9 12  13 11  R 12 R 9 R   Creatinine, Ser 0.76 - 1.27 mg/dL 1.19 1.47  8.29 5.62 1.30 0.93 R   eGFR >59 mL/min/1.73 112 111  109      BUN/Creatinine Ratio 9 - 20 11 13  14 12 13     Sodium 134 - 144 mmol/L 142 141  140 139 140 139 R   Potassium 3.5 - 5.2 mmol/L 4.4 4.2  4.3 4.3 4.5 3.5 R   Chloride 96 - 106 mmol/L 104 103  105 104 103 101 R   Calcium 8.7 - 10.2 mg/dL 9.5 9.4  9.3 9.6 9.7 9.1 R   Phosphorus 2.8 - 4.1 mg/dL 3.6 3.0  3.0 3.6 3.6    Total Protein 6.0 - 8.5 g/dL 6.5 6.9  6.6 6.8 6.9 7.6 R   Albumin 4.1 - 5.1 g/dL 4.2 4.6 R  4.9 R 4.5 R 4.7 R 3.5 R   Globulin, Total 1.5 - 4.5 g/dL 2.3 2.3  1.7 2.3 2.2    Albumin/Globulin Ratio 1.2 - 2.2 1.8 2.0  2.9 High  2.0 2.1    Bilirubin Total 0.0 - 1.2 mg/dL 0.5 0.9  0.8 0.7 0.5 1.2 High  R   Alkaline Phosphatase 44 - 121 IU/L 99 88  82 81 R 84 R 130 R   LDH 121 - 224 IU/L 137 157   195 141 196    AST 0 - 40 IU/L 24 22  20 19 19 21  R   ALT 0 - 44 IU/L 29 40  29 20 21  41 R   GGT 0 - 65 IU/L 19 23  13 13 14     Iron 38 - 169 ug/dL 865 784  99 696 74    Cholesterol, Total 100 - 199 mg/dL 295 284 High   132 440 174    Triglycerides  0 - 149 mg/dL 161 High  096 High   045 121 134    HDL >39 mg/dL 38 Low  39 Low   42 44 39 Low     VLDL Cholesterol Cal 5 - 40 mg/dL 38 31  20 22 24     LDL Chol Calc (NIH) 0 - 99 mg/dL 409 High  811 High   914 High  121 High  111 High     Chol/HDL Ratio 0.0 - 5.0 ratio 4.9 5.2 High  CM  4.2 CM 4.3 CM 4.5 CM    Comment:                                   T. Chol/HDL Ratio                                             Men  Women                               1/2 Avg.Risk  3.4    3.3                                   Avg.Risk  5.0    4.4                                2X Avg.Risk  9.6    7.1                                3X Avg.Risk 23.4   11.0  Estimated CHD Risk 0.0 - 1.0 times avg. 1.0 1.1 High  CM  0.8 CM 0.8 CM 0.9 CM    Comment: The CHD Risk is based on the T. Chol/HDL ratio. Other factors affect CHD Risk such as hypertension, smoking, diabetes, severe obesity, and family history of premature CHD.  TSH 0.450 - 4.500 uIU/mL 1.890 1.740  1.990 1.880 1.850    T4, Total 4.5 - 12.0 ug/dL 5.3 7.3  6.5 5.1 5.4    T3 Uptake Ratio 24 - 39 % 32 35  33 30 32    Free Thyroxine Index 1.2 - 4.9 1.7 2.6  2.1 1.5 1.7    Prostate Specific Ag, Serum 0.0 - 4.0 ng/mL 1.1 0.6 CM  0.6 CM 0.7 CM     Comment: Roche ECLIA methodology. According to the American Urological Association, Serum PSA should decrease and remain at undetectable levels after radical prostatectomy. The AUA defines biochemical recurrence as an initial PSA value 0.2 ng/mL or greater followed by a subsequent confirmatory PSA value 0.2 ng/mL or greater. Values obtained with different assay methods or kits cannot be used interchangeably. Results cannot be interpreted as absolute  evidence of the presence or absence of malignant disease.  WBC 3.4 - 10.8 x10E3/uL 4.1 5.2 4.4 CANCELED R, CM 4.8 4.7  9.6 R  RBC 4.14 - 5.80 x10E6/uL 5.38 5.68 5.71 CANCELED R, CM 5.43 5.53  4.78 R  Hemoglobin 13.0 - 17.7 g/dL 78.2 95.6 21.3 CANCELED R, CM 16.1 16.0    Hematocrit  37.5 - 51.0 % 46.3 47.6 47.7 CANCELED R, CM 46.8 47.5    MCV 79 - 97 fL 86 84 84 CANCELED R, CM 86 86  83 R  MCH 26.6 - 33.0 pg 28.8 29.6 29.1 CANCELED R, CM 29.7 28.9  28.4 R  MCHC 31.5 - 35.7 g/dL 16.1 09.6 04.5 CANCELED R, CM 34.4 33.7  34.1 R  RDW 11.6 - 15.4 % 11.9 12.4 12.0 CANCELED R, CM 12.3 12.2  12.3 R  Platelets 150 - 450 x10E3/uL 260 203 177 CANCELED R, CM 173 170  229 R  Neutrophils Not Estab. % 57 61 61 CANCELED R, CM 57 61  78.1 R  Lymphs Not Estab. % 31 28 24  CANCELED R, CM 31 29    Monocytes Not Estab. % 7 6 9  CANCELED R, CM 7 6    Eos Not Estab. % 4 4 4  CANCELED R, CM 4 3    Basos Not Estab. % 1 1 1  CANCELED R, CM 1 1    Neutrophils Absolute 1.4 - 7.0 x10E3/uL 2.4 3.2 2.8 CANCELED R, CM 2.7 2.9  7.5 High  R  Lymphocytes Absolute 0.7 - 3.1 x10E3/uL 1.3 1.5 1.0 CANCELED R, CM 1.5 1.3  1.2 R  Monocytes Absolute 0.1 - 0.9 x10E3/uL 0.3 0.3 0.4 CANCELED R, CM 0.3 0.3    EOS (ABSOLUTE) 0.0 - 0.4 x10E3/uL 0.2 0.2 0.2 CANCELED R, CM 0.2 0.1    Basophils Absolute 0.0 - 0.2 x10E3/uL 0.0 0.0 0.0 CANCELED R, CM 0.0 0.0  0.0 R  Immature Granulocytes Not Estab. % 0 0 1 CANCELED R, CM 0 0    Immature Grans (Abs)              __________________________________________  EKG  Normal sinus rhythm at 63 bpm ____________________________________________    ____________________________________________   INITIAL IMPRESSION / ASSESSMENT AND PLAN   As part of my medical decision making, I reviewed the following data within the electronic MEDICAL RECORD NUMBER      No acute findings on physical exam, EKG, and labs.  Patient has a rash consistent with tinea.  Patient given a prescription for  Lotrisone.  Follow-up in 2 weeks if no improvement or worsening complaint.        ____________________________________________   FINAL CLINICAL IMPRESSION    ED Discharge Orders     None        Note:  This document was prepared using Dragon voice recognition software and may include unintentional dictation errors.

## 2023-04-19 ENCOUNTER — Other Ambulatory Visit: Payer: Self-pay

## 2023-04-19 NOTE — Progress Notes (Signed)
Completed UDS and BAT for random with CO[facility] after consent signed.

## 2023-10-20 ENCOUNTER — Ambulatory Visit: Payer: Self-pay

## 2023-10-20 DIAGNOSIS — Z0289 Encounter for other administrative examinations: Secondary | ICD-10-CM

## 2023-10-20 LAB — POCT URINALYSIS DIPSTICK
Bilirubin, UA: NEGATIVE
Blood, UA: NEGATIVE
Glucose, UA: NEGATIVE
Ketones, UA: NEGATIVE
Leukocytes, UA: NEGATIVE
Nitrite, UA: NEGATIVE
Protein, UA: NEGATIVE
Spec Grav, UA: 1.025 (ref 1.010–1.025)
Urobilinogen, UA: 0.2 U/dL
pH, UA: 5.5 (ref 5.0–8.0)

## 2023-10-21 LAB — CMP12+LP+TP+TSH+6AC+PSA+CBC…
ALT: 17 IU/L (ref 0–44)
AST: 17 IU/L (ref 0–40)
Albumin: 4.7 g/dL (ref 4.1–5.1)
Alkaline Phosphatase: 89 IU/L (ref 44–121)
BUN/Creatinine Ratio: 10 (ref 9–20)
BUN: 10 mg/dL (ref 6–24)
Basophils Absolute: 0.1 10*3/uL (ref 0.0–0.2)
Basos: 1 %
Bilirubin Total: 0.8 mg/dL (ref 0.0–1.2)
Calcium: 9.6 mg/dL (ref 8.7–10.2)
Chloride: 103 mmol/L (ref 96–106)
Chol/HDL Ratio: 4.3 ratio (ref 0.0–5.0)
Cholesterol, Total: 166 mg/dL (ref 100–199)
Creatinine, Ser: 1.01 mg/dL (ref 0.76–1.27)
EOS (ABSOLUTE): 0.3 10*3/uL (ref 0.0–0.4)
Eos: 5 %
Estimated CHD Risk: 0.8 times avg. (ref 0.0–1.0)
Free Thyroxine Index: 2.1 (ref 1.2–4.9)
GGT: 17 IU/L (ref 0–65)
Globulin, Total: 2.1 g/dL (ref 1.5–4.5)
Glucose: 67 mg/dL — ABNORMAL LOW (ref 70–99)
HDL: 39 mg/dL — ABNORMAL LOW (ref >39)
Hematocrit: 51.7 % — ABNORMAL HIGH (ref 37.5–51.0)
Hemoglobin: 17.1 g/dL (ref 13.0–17.7)
Immature Grans (Abs): 0 10*3/uL (ref 0.0–0.1)
Immature Granulocytes: 1 %
Iron: 105 ug/dL (ref 38–169)
LDH: 149 IU/L (ref 121–224)
LDL Chol Calc (NIH): 99 mg/dL (ref 0–99)
Lymphocytes Absolute: 1.6 10*3/uL (ref 0.7–3.1)
Lymphs: 28 %
MCH: 29.1 pg (ref 26.6–33.0)
MCHC: 33.1 g/dL (ref 31.5–35.7)
MCV: 88 fL (ref 79–97)
Monocytes Absolute: 0.4 10*3/uL (ref 0.1–0.9)
Monocytes: 7 %
Neutrophils Absolute: 3.3 10*3/uL (ref 1.4–7.0)
Neutrophils: 58 %
Phosphorus: 3.3 mg/dL (ref 2.8–4.1)
Platelets: 204 10*3/uL (ref 150–450)
Potassium: 4.5 mmol/L (ref 3.5–5.2)
Prostate Specific Ag, Serum: 0.7 ng/mL (ref 0.0–4.0)
RBC: 5.88 x10E6/uL — ABNORMAL HIGH (ref 4.14–5.80)
RDW: 12.3 % (ref 11.6–15.4)
Sodium: 142 mmol/L (ref 134–144)
T3 Uptake Ratio: 31 % (ref 24–39)
T4, Total: 6.8 ug/dL (ref 4.5–12.0)
TSH: 2.26 u[IU]/mL (ref 0.450–4.500)
Total Protein: 6.8 g/dL (ref 6.0–8.5)
Triglycerides: 161 mg/dL — ABNORMAL HIGH (ref 0–149)
Uric Acid: 6.4 mg/dL (ref 3.8–8.4)
VLDL Cholesterol Cal: 28 mg/dL (ref 5–40)
WBC: 5.5 10*3/uL (ref 3.4–10.8)
eGFR: 95 mL/min/{1.73_m2} (ref >59)

## 2023-10-26 ENCOUNTER — Encounter: Admitting: Physician Assistant

## 2023-10-27 ENCOUNTER — Ambulatory Visit: Payer: Self-pay | Admitting: Physician Assistant

## 2023-10-27 ENCOUNTER — Encounter: Payer: Self-pay | Admitting: Physician Assistant

## 2023-10-27 VITALS — BP 121/83 | HR 85 | Temp 98.2°F | Resp 16 | Wt 204.0 lb

## 2023-10-27 DIAGNOSIS — Z0000 Encounter for general adult medical examination without abnormal findings: Secondary | ICD-10-CM

## 2023-10-27 MED ORDER — CLOTRIMAZOLE-BETAMETHASONE 1-0.05 % EX CREA: 1.0000 | TOPICAL_CREAM | Freq: Every day | CUTANEOUS | 0 refills | Status: AC

## 2023-10-27 MED ORDER — TOLNAFTATE 1 % EX POWD: 1.0000 | Freq: Two times a day (BID) | CUTANEOUS | 0 refills | Status: AC

## 2023-10-27 NOTE — Progress Notes (Signed)
 [facility] ____________________________________________   None    (approximate)  I have reviewed the triage vital signs and the nursing notes.   HISTORY  Chief Complaint No chief complaint on file.   HPI Brett Frank is a 43 y.o. 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  Patient as well as concern for rash in the groin area.  States rash always appear in warm weather.  Mild to moderate itching.  No palliative measure for complaint.         Past Medical History:  Diagnosis Date   Hyperlipidemia    Kidney stones    Plantar fasciitis    TV (tinea versicolor)     Patient Active Problem List   Diagnosis Date Noted   Fever, unspecified 02/06/2013   Acute appendicitis 01/26/2013    Past Surgical History:  Procedure Laterality Date   APPENDECTOMY  01-12-13    Prior to Admission medications   Medication Sig Start Date End Date Taking? Authorizing Provider  clotrimazole -betamethasone  (LOTRISONE ) cream Apply 1 Application topically daily. Patient not taking: Reported on 10/27/2023 09/29/22   Marcina Severe, PA-C    Allergies Patient has no known allergies.  Family History  Problem Relation Age of Onset   Hypertension Father    Leukemia Father    Penile cancer Father    Parkinson's disease Paternal Grandmother     Social History Social History   Tobacco Use   Smoking status: Never   Smokeless tobacco: Current    Types: Snuff  Substance Use Topics   Alcohol use: Yes   Drug use: No    Review of Systems  Constitutional: No fever/chills Eyes: No visual changes. ENT: No sore throat. Cardiovascular: Denies chest pain. Respiratory: Denies shortness of breath. Gastrointestinal: No abdominal pain.  No nausea, no vomiting.  No diarrhea.  No constipation. Genitourinary: Negative for dysuria. Musculoskeletal: Negative for back pain. Skin: Positive for rash in groin area Neurological:  Negative for headaches, focal weakness or numbness.   ____________________________________________   PHYSICAL EXAM:  VITAL SIGNS: BP 121/83  Pulse Rate 85  Temp 98.2 F (36.8 C)  Weight 204 lb (92.5 kg)  Resp 16  SpO2 97 %   BMI: 30.13 kg/m2  BSA: 2.12 m2   Constitutional: Alert and oriented. Well appearing and in no acute distress. Eyes: Conjunctivae are normal. PERRL. EOMI. Head: Atraumatic. Nose: No congestion/rhinnorhea. Mouth/Throat: Mucous membranes are moist.  Oropharynx non-erythematous. Neck: No stridor.  No cervical spine tenderness to palpation. Hematological/Lymphatic/Immunilogical: No cervical lymphadenopathy. Cardiovascular: Normal rate, regular rhythm. Grossly normal heart sounds.  Good peripheral circulation. Respiratory: Normal respiratory effort.  No retractions. Lungs CTAB. Gastrointestinal: Soft and nontender. No distention. No abdominal bruits. No CVA tenderness. Genitourinary: Deferred Musculoskeletal: No lower extremity tenderness nor edema.  No joint effusions. Neurologic:  Normal speech and language. No gross focal neurologic deficits are appreciated. No gait instability. Skin:  Skin is warm, dry and intact.  Tinea cruris Psychiatric: Mood and affect are normal. Speech and behavior are normal.  ____________________________________________   LABS _         Component Ref Range & Units (hover) 7 d ago (10/20/23) 1 yr ago (09/22/22) 2 yr ago (09/09/21) 3 yr ago (09/20/20) 4 yr ago (09/05/19) 4 yr ago (04/18/19)  Color, UA Amber yellow dark yellow Dark Yellow Dark Yellow Dark [place]  Clarity, UA Clear clear clear Clear Clear clear  Glucose, UA Negative Negative Negative Negative Negative Negative  Bilirubin, UA Negative neg negative Negative Negative Negative  Ketones, UA Negative neg negative Negative Negative Negative  Spec Grav, UA 1.025 1.020 >=1.030 Abnormal  >=1.030 Abnormal  1.025 1.025  Blood, UA Negative neg negative Negative Negative  Negative  pH, UA 5.5 6.0 6.0 6.0 6.0 6.0  Protein, UA Negative Negative Negative Negative Negative Negative  Urobilinogen, UA 0.2 0.2 0.2 0.2 0.2 0.2  Nitrite, UA Negative neg negative Negative Negative Negative  Leukocytes, UA Negative Negative Negative Negative Negative Negative  Appearance        Odor             POCT urinalysis dipstick Order: [id number]  Status: Final result     Next appt: None     Dx: Encounter for physical examination re...   Test Result Released: Yes (seen)   0 Result Notes         Component Ref Range & Units (hover) 7 d ago (10/20/23) 1 yr ago (09/22/22) 2 yr ago (09/09/21) 3 yr ago (09/20/20) 4 yr ago (09/05/19) 4 yr ago (04/18/19)  Color, UA Amber yellow dark yellow Dark Yellow Dark Yellow Dark [place]  Clarity, UA Clear clear clear Clear Clear clear  Glucose, UA Negative Negative Negative Negative Negative Negative  Bilirubin, UA Negative neg negative Negative Negative Negative  Ketones, UA Negative neg negative Negative Negative Negative  Spec Grav, UA 1.025 1.020 >=1.030 Abnormal  >=1.030 Abnormal  1.025 1.025  Blood, UA Negative neg negative Negative Negative Negative  pH, UA 5.5 6.0 6.0 6.0 6.0 6.0  Protein, UA Negative Negative Negative Negative Negative Negative  Urobilinogen, UA 0.2 0.2 0.2 0.2 0.2 0.2  Nitrite, UA Negative neg negative Negative Negative Negative  Leukocytes, UA Negative Negative Negative Negative Negative Negative  Appearance        Odor                   View All Conversations on this Encounter              Component Ref Range & Units (hover) 7 d ago (10/20/23) 1 yr ago (09/22/22) 2 yr ago (09/09/21) 3 yr ago (09/26/20) 3 yr ago (09/20/20) 4 yr ago (09/05/19) 4 yr ago (04/18/19)  Glucose 67 Low  88 84  84 R 90 R 85 R  Uric Acid 6.4 6.0 CM 6.6 CM  6.9 CM 6.2 CM 7.0 R, CM  Comment:            Therapeutic target for gout patients: <6.0  BUN 10 9 12  13 11  R 12 R  Creatinine, Ser 1.01 0.84 0.90  0.91 0.92 0.90   eGFR 95 112 111  109    BUN/Creatinine Ratio 10 11 13  14 12 13   Sodium 142 142 141  140 139 140  Potassium 4.5 4.4 4.2  4.3 4.3 4.5  Chloride 103 104 103  105 104 103  Calcium 9.6 9.5 9.4  9.3 9.6 9.7  Phosphorus 3.3 3.6 3.0  3.0 3.6 3.6  Total Protein 6.8 6.5 6.9  6.6 6.8 6.9  Albumin 4.7 4.2 4.6 R  4.9 R 4.5 R 4.7 R  Globulin, Total 2.1 2.3 2.3  1.7 2.3 2.2  Bilirubin Total 0.8 0.5 0.9  0.8 0.7 0.5  Alkaline Phosphatase 89 99 88  82 81 R 84 R  LDH 149 137 157  195 141 196  AST 17 24 22  20 19 19   ALT 17 29 40  29 20 21  GGT 17 19 23  13 13 14   Iron 105 113 136  99 111 74  Cholesterol, Total 166 185 204 High   176 187 174  Triglycerides 161 High  218 High  173 High   107 121 134  HDL 39 Low  38 Low  39 Low   42 44 39 Low   VLDL Cholesterol Cal 28 38 31  20 22 24   LDL Chol Calc (NIH) 99 109 High  134 High   114 High  121 High  111 High   Chol/HDL Ratio 4.3 4.9 CM 5.2 High  CM  4.2 CM 4.3 CM 4.5 CM  Comment:                                   T. Chol/HDL Ratio                                             Men  Women                               1/2 Avg.Risk  3.4    3.3                                   Avg.Risk  5.0    4.4                                2X Avg.Risk  9.6    7.1                                3X Avg.Risk 23.4   11.0  Estimated CHD Risk 0.8 1.0 CM 1.1 High  CM  0.8 CM 0.8 CM 0.9 CM  Comment: The CHD Risk is based on the T. Chol/HDL ratio. Other factors affect CHD Risk such as hypertension, smoking, diabetes, severe obesity, and family history of premature CHD.  TSH 2.260 1.890 1.740  1.990 1.880 1.850  T4, Total 6.8 5.3 7.3  6.5 5.1 5.4  T3 Uptake Ratio 31 32 35  33 30 32  Free Thyroxine Index 2.1 1.7 2.6  2.1 1.5 1.7  Prostate Specific Ag, Serum 0.7 1.1 CM 0.6 CM  0.6 CM 0.7 CM   Comment: Roche ECLIA methodology. According to the American Urological Association, Serum PSA should decrease and remain at undetectable levels after radical prostatectomy. The AUA  defines biochemical recurrence as an initial PSA value 0.2 ng/mL or greater followed by a subsequent confirmatory PSA value 0.2 ng/mL or greater. Values obtained with different assay methods or kits cannot be used interchangeably. Results cannot be interpreted as absolute evidence of the presence or absence of malignant disease.  WBC 5.5 4.1 5.2 4.4 CANCELED R, CM 4.8 4.7  RBC 5.88 High  5.38 5.68 5.71 CANCELED R, CM 5.43 5.53  Hemoglobin 17.1 15.5 16.8 16.6 CANCELED R, CM 16.1 16.0  Hematocrit 51.7 High  46.3 47.6 47.7 CANCELED R, CM 46.8 47.5  MCV 88 86 84 84 CANCELED R, CM 86 86  MCH 29.1 28.8 29.6 29.1 CANCELED R,  CM 29.7 28.9  MCHC 33.1 33.5 35.3 34.8 CANCELED R, CM 34.4 33.7  RDW 12.3 11.9 12.4 12.0 CANCELED R, CM 12.3 12.2  Platelets 204 260 203 177 CANCELED R, CM 173 170  Neutrophils 58 57 61 61 CANCELED R, CM 57 61  Lymphs 28 31 28 24  CANCELED R, CM 31 29  Monocytes 7 7 6 9  CANCELED R, CM 7 6  Eos 5 4 4 4  CANCELED R, CM 4 3  Basos 1 1 1 1  CANCELED R, CM 1 1  Neutrophils Absolute 3.3 2.4 3.2 2.8 CANCELED R, CM 2.7 2.9  Lymphocytes Absolute 1.6 1.3 1.5 1.0 CANCELED R, CM 1.5 1.3  Monocytes Absolute 0.4 0.3 0.3 0.4 CANCELED R, CM 0.3 0.3  EOS (ABSOLUTE) 0.3 0.2 0.2 0.2 CANCELED R, CM 0.2 0.1  Basophils Absolute 0.1 0.0 0.0 0.0 CANCELED R, CM 0.0 0.0  Immature Granulocytes 1 0 0 1 CANCELED R, CM 0 0  Immature Grans (Abs) 0.0 0.0 0.0 0.0 CANCELED R, CM 0.0 0.0  Resulting Agency [facility]          View All Conversations on this Encounter           ____________________________________  EKG  Normal sinus rhythm at 67 bpm ____________________________________________    ____________________________________________   INITIAL IMPRESSION / ASSESSMENT AND PLAN  As part of my medical decision making, I reviewed the following data within the electronic MEDICAL RECORD NUMBER      No acute findings on physical exam, EKG, labs.         ____________________________________________   FINAL CLINICAL IMPRESSION    ED Discharge Orders     None        Note:  This document was prepared using Dragon voice recognition software and may include unintentional dictation errors.

## 2023-10-27 NOTE — Progress Notes (Signed)
 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

## 2023-12-01 ENCOUNTER — Ambulatory Visit (INDEPENDENT_AMBULATORY_CARE_PROVIDER_SITE_OTHER): Payer: Self-pay | Admitting: Urology

## 2023-12-01 ENCOUNTER — Encounter: Payer: Self-pay | Admitting: Urology

## 2023-12-01 VITALS — BP 135/94 | HR 82 | Ht 69.0 in | Wt 200.0 lb

## 2023-12-01 DIAGNOSIS — Z3009 Encounter for other general counseling and advice on contraception: Secondary | ICD-10-CM

## 2023-12-01 NOTE — Progress Notes (Unsigned)
 12/01/2023 2:10 PM   Caron Addie Munroe [birth year] [id number]  Referring provider: No referring provider defined for this encounter.  Chief Complaint  Patient presents with   New Patient (Initial Visit)    HPI: Brett Frank is a 43 y.o. male who presents for vasectomy counseling.  Married with 2 children Denies prior history of chronic scrotal pain, epididymitis or orchitis.  Prior history stone disease No previous history inguinal hernia or pelvic surgery No history of bleeding or clotting disorders   PMH: Past Medical History:  Diagnosis Date   Hyperlipidemia    Kidney stones    Plantar fasciitis    TV (tinea versicolor)     Surgical History: Past Surgical History:  Procedure Laterality Date   APPENDECTOMY  01-12-13    Home Medications:  Allergies as of 12/01/2023   No Known Allergies      Medication List        Accurate as of December 01, 2023  2:10 PM. If you have any questions, ask your nurse or doctor.          clotrimazole -betamethasone  cream Commonly known as: LOTRISONE  Apply 1 Application topically daily.   tolnaftate  1 % powder Commonly known as: TINACTIN Apply 1 Application topically 2 (two) times daily.        Allergies: No Known Allergies  Family History: Family History  Problem Relation Age of Onset   Hypertension Father    Leukemia Father    Penile cancer Father    Parkinson's disease Paternal Grandmother     Social History:  reports that he has never smoked. His smokeless tobacco use includes snuff. He reports current alcohol use. He reports that he does not use drugs.   Physical Exam: BP (!) 135/94   Pulse 82   Ht 5' 9 (1.753 m)   Wt 200 lb (90.7 kg)   BMI 29.53 kg/m   Constitutional:  Alert and oriented, No acute distress. HEENT: [place] AT Respiratory: Normal respiratory effort, no increased work of breathing. GU: Phallus without lesions, testes descended bilaterally without masses or tenderness, spermatic  cord/epididymis palpably normal bilaterally.  Vasa easily palpable bilaterally Psychiatric: Normal mood and affect.   Assessment & Plan:    1.  Undesired fertility Desires to schedule vasectomy We had a long discussion about vasectomy. We specifically discussed the procedure, recovery and the risks, benefits and alternatives of vasectomy. I explained that the procedure entails removal of a segment of each vas deferens, each of which conducts sperm, and that the purpose of this procedure is to cause sterility (inability to produce children or cause pregnancy). Vasectomy is intended to be permanent and irreversible form of contraception. Options for fertility after vasectomy include vasectomy reversal, or sperm retrieval with in vitro fertilization. These options are not always successful, and they may be expensive. We discussed reversible forms of birth control such as condoms, IUD or diaphragms, as well as the option of freezing sperm in a sperm bank prior to the vasectomy procedure. We discussed the importance of avoiding strenuous exercise for four days after vasectomy, and the importance of refraining from any form of ejaculation for seven days after vasectomy. I explained that vasectomy does not produce immediate sterility so another form of contraceptive must be used until sterility is assured by having semen checked for sperm. Thus, a post vasectomy semen analysis is necessary to confirm sterility. Rarely, vasectomy must be repeated. We discussed the approximately 1 in 2,000 risk of pregnancy after vasectomy for men who  have post-vasectomy semen analysis showing absent sperm or rare non-motile sperm. Typical side effects include a small amount of oozing blood, some discomfort and mild swelling in the area of incision.  Vasectomy does not affect sexual performance, function, please, sensation, interest, desire, satisfaction, penile erection, volume of semen or ejaculation. Other rare risks include  allergy or adverse reaction to an anesthetic, testicular atrophy, hematoma, infection/abscess, prolonged tenderness of the vas deferens, pain, swelling, painful nodule or scar (called sperm granuloma) or epididymtis. We discussed chronic testicular pain syndrome. This has been reported to occur in as many as 1-2% of men and may be permanent. This can be treated with medication, small procedures or (rarely) surgery. He indicated he would call back if he desires a preprocedure anxiolytic and would need a driver if utilizing   Glendia JAYSON Barba, MD  [facility] [address], [address] [place], [place] [address] [phone]

## 2023-12-01 NOTE — Patient Instructions (Signed)
 Pre-Vasectomy Instructions ? ?STOP all aspirin or blood thinners (Aspirin, Plavix, Coumadin, Warfarin, Motrin, Ibuprofen, Advil, Aleve, Naproxen, Naprosyn) for 7 days prior to the procedure.  If you have any questions about stopping these medications please contact your primary care physician or cardiologist. ? ?Shave all hair from the upper scrotum on the day of the procedure.  This means just under the penis onto the scrotal sac.  The area shaved should measure about 2-3 inches around.  You may lather the scrotum with soap and water, and shave with a safety razor. ? ?After shaving the area, thoroughly wash the penis and the scrotum, then shower or bathe to remove all the loose hairs.  If needed, wash the area again just before coming in for your Vasectomy. ? ?It is recommended to have a light meal an hour or so prior to the procedure. ? ?Bring a scrotal support (jock strap or suspensory, or tight jockey shorts or underwear).  Wear comfortable pants or shorts. ? ?While the actual procedure usually takes about 45 minutes, you should be prepared to stay in the office for approximately one hour.  Bring someone with you to drive you home. ? ?If you have any questions or concerns, please feel free to call the office at [phone]. ?  ?

## 2024-01-10 ENCOUNTER — Encounter: Payer: Self-pay | Admitting: *Deleted

## 2024-01-10 ENCOUNTER — Other Ambulatory Visit: Payer: Self-pay | Admitting: Urology

## 2024-01-10 ENCOUNTER — Encounter: Payer: Self-pay | Admitting: Urology

## 2024-01-10 MED ORDER — DIAZEPAM 10 MG PO TABS: ORAL_TABLET | ORAL | 0 refills | Status: AC

## 2024-01-12 ENCOUNTER — Ambulatory Visit (INDEPENDENT_AMBULATORY_CARE_PROVIDER_SITE_OTHER): Admitting: Urology

## 2024-01-12 ENCOUNTER — Encounter: Payer: Self-pay | Admitting: Urology

## 2024-01-12 VITALS — BP 128/85 | HR 102 | Ht 69.0 in | Wt 200.0 lb

## 2024-01-12 DIAGNOSIS — Z302 Encounter for sterilization: Secondary | ICD-10-CM | POA: Diagnosis not present

## 2024-01-12 MED ORDER — HYDROCODONE-ACETAMINOPHEN 5-325 MG PO TABS: 1.0000 | ORAL_TABLET | Freq: Four times a day (QID) | ORAL | 0 refills | Status: AC | PRN

## 2024-01-12 NOTE — Progress Notes (Signed)
   Vasectomy Procedure Note  Indications: Brett Frank is a 43 y.o. male who presents today for elective sterilization.  He has been consented for the procedure.  He is aware of the risks and benefits.  He had no additional questions.  He agrees to proceed.  He denies any other significant change since his last visit.  Pre-operative Diagnosis: Elective sterilization  Post-operative Diagnosis: Elective sterilization  Premedication: Valium  10 mg po  Surgeon: Glendia C. Clem Wisenbaker, M.D  Description: The patient was prepped and draped in the standard fashion.  The right vas deferens was identified and brought superiorly to the anterior scrotal skin.  The skin and vas were then anesthetized utilizing 6 ml 1% lidocaine.  A small stab incision was made and spread with the vas dissector.  The vas was grasped utilizing the vas clamp and elevated out of the incision. The vas sheath was incised and the vas was dissected out of the sheath, elevated and dissected free from the surrounding tissue and vessels.  A 1 cm segment was excised. The vas lumens were cauterized with a needle tip electrocautery for a distance of at least 1 cm.  Fascial interposition was performed on the testicular end with a figure-of-eight 3-0 chromic suture.   No significant bleeding was observed.  The vas ends were then dropped back into the hemiscrotum.  The skin was closed with hemostatic pressure.  An identical procedure was performed on the contralateral side.  Clean dry gauze was applied to the incision sites.  The patient tolerated the procedure well.  Complications:None  Recommendations: 1.  No lifting greater than 10 pounds or strenuous activity for 1 week. 2.  Scrotal support for 1-2 weeks. 3.  May shower in 24 hours; no bath, hot tub for 1 week 4.  No intercourse for at least 7 days and resume based on level of discomfort  5.  Continue alternate contraception for 12 weeks.  6.  Call for significant pain, swelling,  redness, drainage or fever greater than 100.5. 7.  Rx hydrocodone /APAP 5/325 1-2 every 6 hours prn pain. 8.  Follow-up semen analysis in 12 weeks.   Glendia Barba, MD

## 2024-01-12 NOTE — Patient Instructions (Signed)

## 2024-04-05 ENCOUNTER — Encounter: Payer: Self-pay | Admitting: Urology

## 2024-04-13 ENCOUNTER — Other Ambulatory Visit

## 2024-04-17 ENCOUNTER — Other Ambulatory Visit: Payer: Self-pay

## 2024-04-17 ENCOUNTER — Other Ambulatory Visit

## 2024-04-17 DIAGNOSIS — Z302 Encounter for sterilization: Secondary | ICD-10-CM

## 2024-04-18 LAB — POST-VAS SPERM EVALUATION,QUAL: Volume: 1 mL

## 2024-04-19 ENCOUNTER — Ambulatory Visit: Payer: Self-pay | Admitting: Urology
